# Patient Record
Sex: Female | Born: 1995 | Race: Black or African American | Hispanic: No | Marital: Single | Smoking: Never smoker
Health system: Southern US, Community
[De-identification: ages and names within clinical notes are randomized; demographics above are authoritative.]

## PROBLEM LIST (undated history)

## (undated) ENCOUNTER — Inpatient Hospital Stay (HOSPITAL_COMMUNITY): Payer: Self-pay

## (undated) DIAGNOSIS — D649 Anemia, unspecified: Secondary | ICD-10-CM

## (undated) HISTORY — PX: NO PAST SURGERIES: SHX2092

---
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
## 2018-02-01 LAB — OB RESULTS CONSOLE ANTIBODY SCREEN: ANTIBODY SCREEN: NEGATIVE

## 2018-02-01 LAB — OB RESULTS CONSOLE ABO/RH: ABO/RH(D): O POS

## 2018-04-12 ENCOUNTER — Other Ambulatory Visit: Payer: Self-pay

## 2018-04-12 ENCOUNTER — Encounter (HOSPITAL_COMMUNITY): Payer: Self-pay

## 2018-04-12 ENCOUNTER — Inpatient Hospital Stay (HOSPITAL_COMMUNITY)
Admission: AD | Admit: 2018-04-12 | Discharge: 2018-04-12 | Disposition: A | Discharge status: Home / Self Care | Payer: Medicaid Other | Attending: Obstetrics and Gynecology | Admitting: Obstetrics and Gynecology

## 2018-04-12 DIAGNOSIS — N949 Unspecified condition associated with female genital organs and menstrual cycle: Secondary | ICD-10-CM | POA: Diagnosis not present

## 2018-04-12 DIAGNOSIS — Z880 Allergy status to penicillin: Secondary | ICD-10-CM | POA: Insufficient documentation

## 2018-04-12 DIAGNOSIS — O26892 Other specified pregnancy related conditions, second trimester: Secondary | ICD-10-CM | POA: Diagnosis not present

## 2018-04-12 DIAGNOSIS — Z3A19 19 weeks gestation of pregnancy: Secondary | ICD-10-CM

## 2018-04-12 DIAGNOSIS — R1032 Left lower quadrant pain: Secondary | ICD-10-CM | POA: Insufficient documentation

## 2018-04-12 DIAGNOSIS — R1031 Right lower quadrant pain: Secondary | ICD-10-CM | POA: Diagnosis not present

## 2018-04-12 HISTORY — DX: Anemia, unspecified: D64.9

## 2018-04-12 LAB — URINALYSIS, ROUTINE W REFLEX MICROSCOPIC
Bilirubin Urine: NEGATIVE
Glucose, UA: NEGATIVE mg/dL
Hgb urine dipstick: NEGATIVE
Ketones, ur: NEGATIVE mg/dL
Nitrite: NEGATIVE
PROTEIN: NEGATIVE mg/dL
Specific Gravity, Urine: 1.015 (ref 1.005–1.030)
pH: 7 (ref 5.0–8.0)

## 2018-04-12 MED ORDER — ACETAMINOPHEN 500 MG PO TABS
1000.0000 mg | ORAL_TABLET | Freq: Once | ORAL | Status: AC
  Administered 2018-04-12: 1000 mg via ORAL
  Filled 2018-04-12: qty 2

## 2018-04-12 NOTE — MAU Provider Note (Signed)
History     CSN: [id number]  Arrival date and time: 04/12/18 0005   First Provider Initiated Contact with Patient 04/12/18 0236      Chief Complaint  Patient presents with  . Abdominal Pain   HPI  Ms.  Gail Wong is a 22 y.o. year old G1P0 female at 7260w6d weeks gestation who presents to MAU reporting an "aching pain in LLQ (rated 7-9/10), "sharp" RLQ pain (rated 10/10); both increases with movement. She reports that all the pain started at ~ 2100 on 04/11/18. She denies any N/V/D, VB or LOF. She reports good (+) FM. [facility] [place] [facility]; where she was last seen on 03/24/18. Her next appt is on 04/20/18.  Past Medical History:  Diagnosis Date  . Anemia     Past Surgical History:  Procedure Laterality Date  . NO PAST SURGERIES      Family History  Problem Relation Age of Onset  . Diabetes Maternal Grandmother     Social History   Tobacco Use  . Smoking status: Never Smoker  . Smokeless tobacco: Never Used  Substance Use Topics  . Alcohol use: Never    Frequency: Never  . Drug use: Never    Allergies:  Allergies  Allergen Reactions  . Amoxicillin Rash  . Lavender Oil Rash    Medications Prior to Admission  Medication Sig Dispense Refill Last Dose  . folic acid (FOLVITE) 1 MG tablet Take 1 mg by mouth daily.   04/11/2018 at Unknown time  . Prenatal Vit-Fe Fumarate-FA (PRENATAL MULTIVITAMIN) TABS tablet Take 1 tablet by mouth daily at 12 noon.   04/11/2018 at Unknown time  . Menthol (RICOLA) LOZG Use as directed 1 each in the mouth or throat every 2 (two) hours as needed (sore throat).   03/22/2015 at Unknown time    Review of Systems  Constitutional: Negative.   HENT: Negative.   Eyes: Negative.   Respiratory: Negative.   Cardiovascular: Negative.   Gastrointestinal: Negative.   Endocrine: Negative.   Genitourinary: Positive for pelvic pain (sharp pain in RLQ, aching pain on LLQ; pain on L>R; increases with  movements). Negative for vaginal bleeding, vaginal discharge and vaginal pain.  Musculoskeletal: Negative.   Skin: Negative.   Allergic/Immunologic: Negative.   Neurological: Negative.   Hematological: Negative.   Psychiatric/Behavioral: Negative.    Physical Exam   Blood pressure 110/66, pulse 95, temperature 97.6 F (36.4 C), temperature source Oral, resp. rate 18, height 5' (1.524 m), weight 48.6 kg, SpO2 100 %.  Physical Exam  Nursing note and vitals reviewed. Constitutional: She is oriented to person, place, and time. She appears well-developed and well-nourished.  HENT:  Head: Normocephalic and atraumatic.  Eyes: Pupils are equal, round, and reactive to light. Conjunctivae are normal.  Neck: Normal range of motion.  Cardiovascular: Normal rate, regular rhythm, normal heart sounds and intact distal pulses.  Respiratory: Effort normal and breath sounds normal.  GI: Soft. Bowel sounds are normal. There is abdominal tenderness (in both groin/round ligament area).  Genitourinary:    Genitourinary Comments: Pelvic declined   Musculoskeletal: Normal range of motion.  Neurological: She is alert and oriented to person, place, and time.  Skin: Skin is warm and dry.  Psychiatric: She has a normal mood and affect. Her behavior is normal. Judgment and thought content normal.    MAU Course  Procedures  MDM CCUA Tylenol 1000 mg po -- improved pain FHTs by doppler: 154 bpm  Results for  orders placed or performed during the hospital encounter of 04/12/18 (from the past 24 hour(s))  Urinalysis, Routine w reflex microscopic     Status: Abnormal   Collection Time: 04/12/18  1:21 AM  Result Value Ref Range   Color, Urine YELLOW YELLOW   APPearance CLEAR CLEAR   Specific Gravity, Urine 1.015 1.005 - 1.030   pH 7.0 5.0 - 8.0   Glucose, UA NEGATIVE NEGATIVE mg/dL   Hgb urine dipstick NEGATIVE NEGATIVE   Bilirubin Urine NEGATIVE NEGATIVE   Ketones, ur NEGATIVE NEGATIVE mg/dL    Protein, ur NEGATIVE NEGATIVE mg/dL   Nitrite NEGATIVE NEGATIVE   Leukocytes, UA SMALL (A) NEGATIVE   RBC / HPF 0-5 0 - 5 RBC/hpf   WBC, UA 6-10 0 - 5 WBC/hpf   Bacteria, UA RARE (A) NONE SEEN   Squamous Epithelial / LPF 0-5 0 - 5   Mucus PRESENT     Assessment and Plan  Round ligament pain - Plan: Discharge patient - Demonstrated exercises to stretch round ligaments - Comfort measures given for RLP - Information provided on RLP - Ok to take Tylenol 1000 mg every 6 hrs prn pain - List of [facility] providers given to pt by request - Discharge home - Keep scheduled appt with CNMs on 04/21/18 - Patient verbalized an understanding of the plan of care and agrees.   Raelyn Moraolitta Lauriel Helin, MSN, CNM 04/12/2018, 2:36 AM

## 2018-04-12 NOTE — Discharge Instructions (Signed)
Safe Medications in Pregnancy  ° °Acne: °Benzoyl Peroxide °Salicylic Acid ° °Backache/Headache: °Tylenol: 2 regular strength every 4 hours OR °             2 Extra strength every 6 hours ° °Colds/Coughs/Allergies: °Benadryl (alcohol free) 25 mg every 6 hours as needed °Breath right strips °Claritin °Cepacol throat lozenges °Chloraseptic throat spray °Cold-Eeze- up to three times per day °Cough drops, alcohol free °Flonase (by prescription only) °Guaifenesin °Mucinex °Robitussin DM (plain only, alcohol free) °Saline nasal spray/drops °Sudafed (pseudoephedrine) & Actifed ** use only after [redacted] weeks gestation and if you do not have high blood pressure °Tylenol °Vicks Vaporub °Zinc lozenges °Zyrtec  ° °Constipation: °Colace °Ducolax suppositories °Fleet enema °Glycerin suppositories °Metamucil °Milk of magnesia °Miralax °Senokot °Smooth move tea ° °Diarrhea: °Kaopectate °Imodium A-D ° °*NO pepto Bismol ° °Hemorrhoids: °Anusol °Anusol HC °Preparation H °Tucks ° °Indigestion: °Tums °Maalox °Mylanta °Zantac  °Pepcid ° °Insomnia: °Benadryl (alcohol free) 25mg every 6 hours as needed °Tylenol PM °Unisom, no Gelcaps ° °Leg Cramps: °Tums °MagGel ° °Nausea/Vomiting:  °Bonine °Dramamine °Emetrol °Ginger extract °Sea bands °Meclizine  °Nausea medication to take during pregnancy:  °Unisom (doxylamine succinate 25 mg tablets) Take one tablet daily at bedtime. If symptoms are not adequately controlled, the dose can be increased to a maximum recommended dose of two tablets daily (1/2 tablet in the morning, 1/2 tablet mid-afternoon and one at bedtime). °Vitamin B6 100mg tablets. Take one tablet twice a day (up to 200 mg per day). ° °Skin Rashes: °Aveeno products °Benadryl cream or 25mg every 6 hours as needed °Calamine Lotion °1% cortisone cream ° °Yeast infection: °Gyne-lotrimin 7 °Monistat 7 ° ° °**If taking multiple medications, please check labels to avoid duplicating the same active ingredients °**take medication as directed on  the label °** Do not exceed 4000 mg of tylenol in 24 hours °**Do not take medications that contain aspirin or ibuprofen ° ° ° °[facility] Providers °         °[facility]  ° Phone: [phone] ° °[facility] @ [facility]  ° Phone: [phone] ° °[facility]      ° Phone: [phone]   °         °[facility] @ [facility]    ° Phone: [phone] °         °[facility] @ [place] [facility]  ° Phone: [phone] ° °[facility] ° Phone: [phone] °    °[facility] ° Phone: [phone] ° °

## 2018-04-12 NOTE — MAU Note (Signed)
Pt c/o "aching pain on upper left abdomen, rating pain 7-9, increasing w/movement. Also has sharp pain on upper right abdomen, rating pain 10/10, not currently happening. All started 2100 last night. Denies LOF, bleeding, n/v/d.  +FM

## 2018-05-25 ENCOUNTER — Other Ambulatory Visit: Payer: Self-pay

## 2018-05-25 ENCOUNTER — Inpatient Hospital Stay (HOSPITAL_COMMUNITY)
Admission: AD | Admit: 2018-05-25 | Discharge: 2018-05-26 | DRG: 833 | Disposition: A | Discharge status: Other Acute Inpatient Hospital | Payer: Medicaid Other | Attending: Obstetrics and Gynecology | Admitting: Obstetrics and Gynecology

## 2018-05-25 ENCOUNTER — Inpatient Hospital Stay (HOSPITAL_COMMUNITY): Payer: Medicaid Other

## 2018-05-25 ENCOUNTER — Encounter (HOSPITAL_COMMUNITY): Payer: Self-pay | Admitting: *Deleted

## 2018-05-25 DIAGNOSIS — O4692 Antepartum hemorrhage, unspecified, second trimester: Secondary | ICD-10-CM | POA: Diagnosis not present

## 2018-05-25 DIAGNOSIS — O368320 Maternal care for abnormalities of the fetal heart rate or rhythm, second trimester, not applicable or unspecified: Secondary | ICD-10-CM | POA: Diagnosis present

## 2018-05-25 DIAGNOSIS — O3432 Maternal care for cervical incompetence, second trimester: Secondary | ICD-10-CM | POA: Diagnosis not present

## 2018-05-25 DIAGNOSIS — Z3A26 26 weeks gestation of pregnancy: Secondary | ICD-10-CM | POA: Diagnosis not present

## 2018-05-25 DIAGNOSIS — Z363 Encounter for antenatal screening for malformations: Secondary | ICD-10-CM

## 2018-05-25 LAB — URINALYSIS, ROUTINE W REFLEX MICROSCOPIC
Bilirubin Urine: NEGATIVE
Glucose, UA: NEGATIVE mg/dL
KETONES UR: 40 mg/dL — AB
NITRITE: NEGATIVE
PROTEIN: NEGATIVE mg/dL
Specific Gravity, Urine: 1.02 (ref 1.005–1.030)
pH: 7.5 (ref 5.0–8.0)

## 2018-05-25 LAB — CBC
HCT: 34.1 % — ABNORMAL LOW (ref 36.0–46.0)
Hemoglobin: 11.7 g/dL — ABNORMAL LOW (ref 12.0–15.0)
MCH: 30.2 pg (ref 26.0–34.0)
MCHC: 34.3 g/dL (ref 30.0–36.0)
MCV: 88.1 fL (ref 80.0–100.0)
Platelets: 297 10*3/uL (ref 150–400)
RBC: 3.87 MIL/uL (ref 3.87–5.11)
RDW: 14.1 % (ref 11.5–15.5)
WBC: 10.7 10*3/uL — ABNORMAL HIGH (ref 4.0–10.5)
nRBC: 0 % (ref 0.0–0.2)

## 2018-05-25 LAB — URINALYSIS, MICROSCOPIC (REFLEX)

## 2018-05-25 LAB — WET PREP, GENITAL
Clue Cells Wet Prep HPF POC: NONE SEEN
Sperm: NONE SEEN
Trich, Wet Prep: NONE SEEN
Yeast Wet Prep HPF POC: NONE SEEN

## 2018-05-25 LAB — TYPE AND SCREEN
ABO/RH(D): O POS
Antibody Screen: NEGATIVE

## 2018-05-25 LAB — ABO/RH: ABO/RH(D): O POS

## 2018-05-25 MED ORDER — LACTATED RINGERS IV SOLN: 500.0000 mL | INTRAVENOUS | Status: DC | PRN

## 2018-05-25 MED ORDER — SOD CITRATE-CITRIC ACID 500-334 MG/5ML PO SOLN: 30.0000 mL | ORAL | Status: DC | PRN

## 2018-05-25 MED ORDER — ONDANSETRON HCL 4 MG/2ML IJ SOLN: 4.0000 mg | Freq: Four times a day (QID) | INTRAMUSCULAR | Status: DC | PRN

## 2018-05-25 MED ORDER — CALCIUM CARBONATE ANTACID 500 MG PO CHEW: 2.0000 | CHEWABLE_TABLET | ORAL | Status: DC | PRN

## 2018-05-25 MED ORDER — MAGNESIUM SULFATE 40 G IN LACTATED RINGERS - SIMPLE
3.0000 g/h | INTRAVENOUS | Status: DC
  Administered 2018-05-25: 3 g/h via INTRAVENOUS
  Filled 2018-05-25 (×2): qty 500

## 2018-05-25 MED ORDER — LIDOCAINE HCL (PF) 1 % IJ SOLN
30.0000 mL | INTRAMUSCULAR | Status: DC | PRN
  Filled 2018-05-25: qty 30

## 2018-05-25 MED ORDER — OXYCODONE-ACETAMINOPHEN 5-325 MG PO TABS: 2.0000 | ORAL_TABLET | ORAL | Status: DC | PRN

## 2018-05-25 MED ORDER — OXYTOCIN 40 UNITS IN NORMAL SALINE INFUSION - SIMPLE MED: 2.5000 [IU]/h | INTRAVENOUS | Status: DC

## 2018-05-25 MED ORDER — ACETAMINOPHEN 325 MG PO TABS
650.0000 mg | ORAL_TABLET | ORAL | Status: DC | PRN
  Administered 2018-05-25: 650 mg via ORAL
  Filled 2018-05-25: qty 2

## 2018-05-25 MED ORDER — OXYCODONE-ACETAMINOPHEN 5-325 MG PO TABS: 1.0000 | ORAL_TABLET | ORAL | Status: DC | PRN

## 2018-05-25 MED ORDER — DOCUSATE SODIUM 100 MG PO CAPS
100.0000 mg | ORAL_CAPSULE | Freq: Every day | ORAL | Status: DC
  Filled 2018-05-25: qty 1

## 2018-05-25 MED ORDER — INDOMETHACIN 50 MG PO CAPS
50.0000 mg | ORAL_CAPSULE | Freq: Once | ORAL | Status: AC
  Administered 2018-05-25: 50 mg via ORAL
  Filled 2018-05-25: qty 1

## 2018-05-25 MED ORDER — MAGNESIUM SULFATE BOLUS VIA INFUSION
4.0000 g | Freq: Once | INTRAVENOUS | Status: AC
  Administered 2018-05-25: 4 g via INTRAVENOUS
  Filled 2018-05-25: qty 500

## 2018-05-25 MED ORDER — LACTATED RINGERS IV SOLN
INTRAVENOUS | Status: DC
  Administered 2018-05-25 – 2018-05-26 (×4): via INTRAVENOUS

## 2018-05-25 MED ORDER — INDOMETHACIN 25 MG PO CAPS
25.0000 mg | ORAL_CAPSULE | Freq: Three times a day (TID) | ORAL | Status: DC
  Administered 2018-05-25 – 2018-05-26 (×3): 25 mg via ORAL
  Filled 2018-05-25 (×4): qty 1

## 2018-05-25 MED ORDER — BETAMETHASONE SOD PHOS & ACET 6 (3-3) MG/ML IJ SUSP
12.0000 mg | INTRAMUSCULAR | Status: AC
  Administered 2018-05-25 – 2018-05-26 (×2): 12 mg via INTRAMUSCULAR
  Filled 2018-05-25 (×2): qty 2

## 2018-05-25 MED ORDER — PRENATAL MULTIVITAMIN CH
1.0000 | ORAL_TABLET | Freq: Every day | ORAL | Status: DC
  Filled 2018-05-25: qty 1

## 2018-05-25 MED ORDER — ZOLPIDEM TARTRATE 5 MG PO TABS: 5.0000 mg | ORAL_TABLET | Freq: Every evening | ORAL | Status: DC | PRN

## 2018-05-25 MED ORDER — OXYTOCIN BOLUS FROM INFUSION: 500.0000 mL | Freq: Once | INTRAVENOUS | Status: DC

## 2018-05-25 NOTE — Progress Notes (Signed)
Gail Wong is a 23 y.o. G1P0 at [redacted]w[redacted]d by LMP admitted for Preterm labor, bulging membranes and advanced dilatation.   Subjective: Patient is now comfortable in trendelenberg position, contractions are rare, cat I strip for 26 weeker. No LOF. Minimal VB. Not ambulating, SCDs in place. Denies abdominal pain.   Objective: BP 101/68   Pulse 99   Temp 98 F (36.7 C) (Oral)   Resp 18   Wt 49 kg   LMP  (LMP Unknown)   SpO2 100%   BMI 21.12 kg/m  No intake/output data recorded. No intake/output data recorded.  FHT:  FHR: 145 bpm, variability: moderate,  accelerations:  Present,  decelerations:  Absent UC:   Rare now SVE:   Dilation: 4 Effacement (%): 70 Station: Ballotable Exam by:: Wynelle Bourgeois CNM    PELVIC EXAM:  Dark red blood in posterior vault, visually bulging membrane with what appears is head through cervical os, appears 5cm dilated and about 90 percent effaced.   Labs: Lab Results  Component Value Date   WBC 10.7 (H) 05/25/2018   HGB 11.7 (L) 05/25/2018   HCT 34.1 (L) 05/25/2018   MCV 88.1 05/25/2018   PLT 297 05/25/2018    Assessment / Plan: 23 y/o G1P0 at [redacted]w[redacted]d by LMP here with PTL with bulging membranes, and advanced dilatation  Labor: Quiet, but advanced dilatation and change in dilation/effacement since arrival this AM. Preeclampsia:  NEG Fetal Wellbeing:  Category I Pain Control:  Indomethacin protocol I/D:  Unknown, awaiting culture results Anticipated MOD:  Delayed labor, cephalic presentation  PTL: Mag gtt @ 3g/hr, Indomethacin 25mg  q8h PO, BMZ given at 06:45, to be repeated tomorrow AM at 06:45. Continue monitoring on L&D until stable without change.  Spoke with Dr. Vergie Living who agrees with plan.   Jen Mow, DO 05/25/2018, 2:54 PM

## 2018-05-25 NOTE — Progress Notes (Signed)
Gail Wong is a 23 y.o. G1P0 at [redacted]w[redacted]d by LMP admitted for Preterm labor, bulging membranes  Subjective: Patient comfortable in room in trendelenberg. She no longer feels any contractions (for the last 4+ hours). Denies LOF, VB.   Objective: BP 109/74   Pulse 100   Temp 98 F (36.7 C) (Oral)   Resp 18   Wt 49 kg   LMP  (LMP Unknown)   SpO2 93%   BMI 21.12 kg/m  I/O last 3 completed shifts: In: -  Out: 350 [Urine:350] No intake/output data recorded.  FHT:  FHR: 145 bpm, variability: moderate,  accelerations:  Present,  decelerations:  Absent UC:   none SVE:   Dilation: 5 Effacement (%): 90 Station: Ballotable Exam by:: Dr Omer Jack  Bulging membranes  Labs: Lab Results  Component Value Date   WBC 10.7 (H) 05/25/2018   HGB 11.7 (L) 05/25/2018   HCT 34.1 (L) 05/25/2018   MCV 88.1 05/25/2018   PLT 297 05/25/2018    Assessment / Plan: 23 y/o G1P0 at [redacted]w[redacted]d here for preterm labor with bulging membranes and advanced dilatation  Labor: Medically stopped labor progression, advanced dilatation with bulging membranes Preeclampsia:  None Fetal Wellbeing:  Category I Pain Control:  Indomethacin for contractions I/D:  GBS culture pending, preterm unknown currently, intact Anticipated MOD:  NSVD if necessary, cephalic presentation Regular diet  Spoke with Dr. Shawnie Pons who agrees with transfer and care. Patient made aware to inform staff IMMEDIATELY if contractions return.  Jen Mow, DO 05/25/2018, 7:37 PM

## 2018-05-25 NOTE — MAU Note (Signed)
Pt presents to MAU c/o vaginal bleeding and abdominal cramping. Pt states she was cramping all night and when she woke up to use the bathroom she noticed some bleeding when she wiped and then saw a clot. Pt reports +FM. Pt denies LOF. Pain is 8/10.

## 2018-05-25 NOTE — H&P (Signed)
LABOR AND DELIVERY ADMISSION HISTORY AND PHYSICAL NOTE  Gail Wong is a 23 y.o. female G1P0 with IUP at [redacted]w[redacted]d by LMP=7w [place] presenting for preterm labor.  Had intercourse yesterday evening. After that she started to have contractions, which have become more frequent and painful. In MAU, was checked and was 4cm dilated. Has been getting prenatal care at [facility]  She reports positive fetal movement. She denies leakage of fluid or vaginal bleeding.  Prenatal History/Complications: PNC at [facility] Pregnancy complications:  - Varicella and rubella nonimmune  Past Medical History: Past Medical History:  Diagnosis Date  . Anemia    last Hgb 12.0 from 01/31/18 lab result under care everywhere ([facility])    Past Surgical History: Past Surgical History:  Procedure Laterality Date  . NO PAST SURGERIES      Obstetrical History: OB History    Gravida  1   Para      Term      Preterm      AB      Living        SAB      TAB      Ectopic      Multiple      Live Births              Social History: Social History   Socioeconomic History  . Marital status: Single    Spouse name: Not on file  . Number of children: Not on file  . Years of education: Not on file  . Highest education level: Not on file  Occupational History  . Not on file  Social Needs  . Financial resource strain: Not on file  . Food insecurity:    Worry: Not on file    Inability: Not on file  . Transportation needs:    Medical: Not on file    Non-medical: Not on file  Tobacco Use  . Smoking status: Never Smoker  . Smokeless tobacco: Never Used  Substance and Sexual Activity  . Alcohol use: Never    Frequency: Never  . Drug use: Never  . Sexual activity: Yes    Comment: last intercourse 05/24/2018  Lifestyle  . Physical activity:    Days per week: Not on file    Minutes per session: Not on file  . Stress: Not on file  Relationships  . Social connections:    Talks on phone: Not on  file    Gets together: Not on file    Attends religious service: Not on file    Active member of club or organization: Not on file    Attends meetings of clubs or organizations: Not on file    Relationship status: Not on file  Other Topics Concern  . Not on file  Social History Narrative  . Not on file    Family History: Family History  Problem Relation Age of Onset  . Diabetes Maternal Grandmother     Allergies: Allergies  Allergen Reactions  . Amoxicillin Rash  . Lavender Oil Rash  . Pork-Derived Products Rash    HA and vomiting    Medications Prior to Admission  Medication Sig Dispense Refill Last Dose  . folic acid (FOLVITE) 1 MG tablet Take 1 mg by mouth daily.   05/24/2018 at Unknown time  . Prenatal Vit-Fe Fumarate-FA (PRENATAL MULTIVITAMIN) TABS tablet Take 1 tablet by mouth daily at 12 noon.   05/24/2018 at Unknown time  . Menthol (RICOLA) LOZG Use as directed 1 each in  the mouth or throat every 2 (two) hours as needed (sore throat).   03/22/2015 at Unknown time     Review of Systems  All systems reviewed and negative except as stated in HPI  Physical Exam Blood pressure 112/70, pulse 96, temperature 97.7 F (36.5 C), weight 49 kg. General appearance: alert, oriented, tearful Lungs: normal respiratory effort Heart: regular rate Abdomen: soft, non-tender; gravid, FH appropriate for GA Extremities: No calf swelling or tenderness Presentation: cephalic Fetal monitoring: 150/mod variability/+ acels/?variable decels  Uterine activity: regular painful contractions Dilation: 4 Effacement (%): 70 Station: Ballotable Exam by:: Wynelle Bourgeois CNM   Prenatal labs: ABO, Rh: --/--/O positive (10/16 0000) Antibody: Negative (10/16 0000) Rubella:  nonimmune RPR:   nonreactive HBsAg:   negative HIV:   negative GC/Chlamydia: negative GBS:   pending (collected on admission) 2-hr GTT: none Genetic screening:  declined Anatomy US: normal  Prenatal Transfer Tool   Maternal Diabetes: No Genetic Screening: Declined Maternal Ultrasounds/Referrals: Normal Fetal Ultrasounds or other Referrals:  None Maternal Substance Abuse:  No Significant Maternal Medications:  None Significant Maternal Lab Results: None  Results for orders placed or performed during the hospital encounter of 05/25/18 (from the past 24 hour(s))  Urinalysis, Routine w reflex microscopic   Collection Time: 05/25/18  5:50 AM  Result Value Ref Range   Color, Urine YELLOW YELLOW   APPearance CLEAR CLEAR   Specific Gravity, Urine 1.020 1.005 - 1.030   pH 7.5 5.0 - 8.0   Glucose, UA NEGATIVE NEGATIVE mg/dL   Hgb urine dipstick LARGE (A) NEGATIVE   Bilirubin Urine NEGATIVE NEGATIVE   Ketones, ur 40 (A) NEGATIVE mg/dL   Protein, ur NEGATIVE NEGATIVE mg/dL   Nitrite NEGATIVE NEGATIVE   Leukocytes, UA LARGE (A) NEGATIVE  Urinalysis, Microscopic (reflex)   Collection Time: 05/25/18  5:50 AM  Result Value Ref Range   RBC / HPF 6-10 0 - 5 RBC/hpf   WBC, UA 6-10 0 - 5 WBC/hpf   Bacteria, UA FEW (A) NONE SEEN   Squamous Epithelial / LPF 0-5 0 - 5    Patient Active Problem List   Diagnosis Date Noted  . Preterm labor 05/25/2018  . Round ligament pain 04/12/2018    Assessment: Gail Wong is a 23 y.o. G1P0 at [redacted]w[redacted]d here for preterm labor  #Labor: will give fluids, magnesium, betamethasone, indomethacin protocol. NICU notified.  #Pain: Desires unmedicated delivery #FWB:  cat 1 #ID:  GBS pending #MOF: breast #MOC: discuss at follow up  #Circ:  ?  Gwenevere Abbot, MD  05/25/2018, 6:45 AM

## 2018-05-25 NOTE — Consult Note (Signed)
Asked by Dr.Arnold to provide prenatal consultation for patient at risk for preterm delivery due to preterm labor at 26.0 wks.  Mother is 23 y.o. G1 P0 who had onset UCs last night and found to be 4 cm dilated with bulging membranes.  Pregnancy previously uncomplicated. She is being treated with betamethasone (first dose about 0645 today), MgSO4, and indomethacin; and she is in [place] position.  FHR shows good variability.  Patient was somnolent and only intermittently responsive during my discussion, but FOB and both MGM and PGM were present and interactive.  I discussed usual expectations for preterm infant at 57 - [redacted] weeks gestation, including possible needs for DR resuscitation, respiratory support, IV access, and blood products.  Also presented risks of death or serious morbidity, and projected possible length of stay in NICU until EDC, i.e [redacted] wks EGA.  Discussed advantages of feeding with mother's milk and possible use of donor milk.  MGM stated she breast fed all of her children and would be supportive.  Thank you for consulting Neonatology. Total time 30 minutes, 20 minutes face-to-face  JWimmer, MD

## 2018-05-25 NOTE — MAU Provider Note (Signed)
Chief Complaint:  Vaginal Bleeding   First Provider Initiated Contact with Patient 05/25/18 [phone]     HPI: Gail Wong is a 23 y.o. G1P0 at 44w0dwho presents to maternity admissions reporting bleeding when she went to bathroom at 4am. Has had cramping much of the day.  Did have IC at 6pm. She reports good fetal movement, denies LOF, vaginal itching/burning, urinary symptoms, h/a, dizziness, n/v, diarrhea, constipation or fever/chills.    Pregnancy has been followed at [facility] and has been uncomplicated.  Went there despite living in [place] because she wanted midwifery care and when she called around she was told there are no midwives in [place]. States had some cramping a few weeks ago and was told it was round ligament pain.  Vaginal Bleeding  The patient's primary symptoms include pelvic pain and vaginal bleeding. The patient's pertinent negatives include no genital itching, genital lesions or genital odor. This is a new problem. The current episode started today. The problem occurs constantly. The problem has been unchanged. The pain is moderate. The problem affects both sides. She is pregnant. Associated symptoms include abdominal pain. Pertinent negatives include no chills, constipation, diarrhea, fever, nausea or vomiting. The vaginal discharge was bloody. The vaginal bleeding is lighter than menses. She has been passing clots. She has not been passing tissue. Nothing aggravates the symptoms. She has tried nothing for the symptoms. She is sexually active.    RN note: Pt presents to MAU c/o vaginal bleeding and abdominal cramping. Pt states she was cramping all night and when she woke up to use the bathroom she noticed some bleeding when she wiped and then saw a clot. Pt reports +FM. Pt denies LOF. Pain is 8/10.  Past Medical History: Past Medical History:  Diagnosis Date  . Anemia    last Hgb 12.0 from 01/31/18 lab result under care everywhere ([facility])    Past obstetric history: OB  History  Gravida Para Term Preterm AB Living  1            SAB TAB Ectopic Multiple Live Births               # Outcome Date GA Lbr Len/2nd Weight Sex Delivery Anes PTL Lv  1 Current             Past Surgical History: Past Surgical History:  Procedure Laterality Date  . NO PAST SURGERIES      Family History: Family History  Problem Relation Age of Onset  . Diabetes Maternal Grandmother     Social History: Social History   Tobacco Use  . Smoking status: Never Smoker  . Smokeless tobacco: Never Used  Substance Use Topics  . Alcohol use: Never    Frequency: Never  . Drug use: Never    Allergies:  Allergies  Allergen Reactions  . Amoxicillin Rash  . Lavender Oil Rash    Meds:  Medications Prior to Admission  Medication Sig Dispense Refill Last Dose  . folic acid (FOLVITE) 1 MG tablet Take 1 mg by mouth daily.   05/24/2018 at Unknown time  . Prenatal Vit-Fe Fumarate-FA (PRENATAL MULTIVITAMIN) TABS tablet Take 1 tablet by mouth daily at 12 noon.   05/24/2018 at Unknown time  . Menthol (RICOLA) LOZG Use as directed 1 each in the mouth or throat every 2 (two) hours as needed (sore throat).   03/22/2015 at Unknown time    I have reviewed patient's Past Medical Hx, Surgical Hx, Family Hx, Social Hx, medications and allergies.  ROS:  Review of Systems  Constitutional: Negative for chills and fever.  Gastrointestinal: Positive for abdominal pain. Negative for constipation, diarrhea, nausea and vomiting.  Genitourinary: Positive for pelvic pain and vaginal bleeding.   Other systems negative  Physical Exam   Patient Vitals for the past 24 hrs:  BP Temp Pulse Weight  05/25/18 0554 112/70 97.7 F (36.5 C) 96 -  05/25/18 0549 - - - 49 kg   Constitutional: Well-developed, well-nourished female in no acute distress.  Cardiovascular: normal rate and rhythm Respiratory: normal effort, clear to auscultation bilaterally GI: Abd soft, non-tender, gravid appropriate for  gestational age.   No rebound or guarding. MS: Extremities nontender, no edema, normal ROM Neurologic: Alert and oriented x 4.  GU: Neg CVAT.  PELVIC EXAM: Cervix open 4cm, bulging membranes.  Light red mucous in vault, no ferning    FHT:  Baseline 140 , moderate variability, accelerations present, no decelerations Contractions: q 2-3 mins    Labs: Results for orders placed or performed during the hospital encounter of 05/25/18 (from the past 24 hour(s))  Urinalysis, Routine w reflex microscopic     Status: Abnormal   Collection Time: 05/25/18  5:50 AM  Result Value Ref Range   Color, Urine YELLOW YELLOW   APPearance CLEAR CLEAR   Specific Gravity, Urine 1.020 1.005 - 1.030   pH 7.5 5.0 - 8.0   Glucose, UA NEGATIVE NEGATIVE mg/dL   Hgb urine dipstick LARGE (A) NEGATIVE   Bilirubin Urine NEGATIVE NEGATIVE   Ketones, ur 40 (A) NEGATIVE mg/dL   Protein, ur NEGATIVE NEGATIVE mg/dL   Nitrite NEGATIVE NEGATIVE   Leukocytes, UA LARGE (A) NEGATIVE  Urinalysis, Microscopic (reflex)     Status: Abnormal   Collection Time: 05/25/18  5:50 AM  Result Value Ref Range   RBC / HPF 6-10 0 - 5 RBC/hpf   WBC, UA 6-10 0 - 5 WBC/hpf   Bacteria, UA FEW (A) NONE SEEN   Squamous Epithelial / LPF 0-5 0 - 5    --/--/O positive (10/16 0000)  Imaging:  No results found.  MAU Course/MDM: I have ordered labs and placed admit orders NST reviewed, reassuring Consult Dr Despina HiddenEure with presentation, exam findings and test results.   Placed in Trendelenberg position to help membranes recede. IV started and Magnesium Sulfate infusion ordered (4g/2g/hr) Cultures obtained, GC, Chlam, GBS, Wet prep) Will insert foley into bladder for 24 hrs.   US ordered for evaluation and measurements NICU consult ordered  Assessment: Single intrauterine pregnancy at 4739w0d Preterm labor with bulging membranes No evidence of PPROM Bleeding is likely due to cervical changes  [facility]  If  stabilizes will move to Antenatal See above for plan Dr Aneta MinsPhillip notified  Wynelle BourgeoisMarie Sohaib Vereen CNM, MSN Certified Nurse-Midwife 05/25/2018 6:09 AM

## 2018-05-26 DIAGNOSIS — Z3A26 26 weeks gestation of pregnancy: Secondary | ICD-10-CM

## 2018-05-26 LAB — HIV ANTIBODY (ROUTINE TESTING W REFLEX): HIV Screen 4th Generation wRfx: NONREACTIVE

## 2018-05-26 NOTE — Progress Notes (Signed)
Accepting provider Dr Mcneil Sober

## 2018-05-26 NOTE — Progress Notes (Signed)
Report called to L. Manson Passey Charity fundraiser at [facility].

## 2018-05-26 NOTE — Discharge Summary (Addendum)
Discharge Summary   Admit Date: 05/25/2018 Discharge Date: 05/26/2018 Discharging Service: Antepartum  Primary OBGYN: [facility] [place], [place]) Admitting Physician: Lazaro Arms, MD  Discharge Physician: Vergie Living  Referring Provider: [facility] Triage  Primary Care Provider: Patient, No Pcp Per  Admission Diagnoses: *Pregnancy at 26/0 *VB and abdominal pain *Preterm labor  Discharge Diagnoses: *Pregnancy at 26/1 *Resolved preterm labor  Consult Orders: IP CONSULT TO NEONATOLOGY   Surgeries/Procedures Performed: Anatomy u/s  History and Physical: Attestation signed by Lazaro Arms, MD at 05/25/2018 1:23 PM  Attestation of Attending Supervision of Advanced Practitioner (CNM/NP/PA): Evaluation and management procedures were performed by the Advanced Practitioner under my supervision and collaboration. I have reviewed the Advanced Practitioner's note and chart, and I agree with the management and plan.  Rockne Coons MD Attending Physician for the Center for [facility] 05/25/2018 1:23 PM       LABOR AND DELIVERY ADMISSION HISTORY AND PHYSICAL NOTE  Gail Wong is a 23 y.o. female G1P0 with IUP at [redacted]w[redacted]d by LMP=7w [place] presenting for preterm labor.  Had intercourse yesterday evening. After that she started to have contractions, which have become more frequent and painful. In MAU, was checked and was 4cm dilated. Has been getting prenatal care at [facility].  She reports positive fetal movement. She denies leakage of fluid or vaginal bleeding.  Prenatal History/Complications: PNC at [facility] Pregnancy complications:  - Varicella and rubella nonimmune  Past Medical History:     Past Medical History:  Diagnosis Date  . Anemia    last Hgb 12.0 from 01/31/18 lab result under care everywhere ([facility])    Past Surgical History:      Past Surgical History:  Procedure Laterality Date  . NO PAST SURGERIES      Obstetrical History:        OB History    Gravida  1   Para      Term      Preterm      AB      Living        SAB      TAB      Ectopic      Multiple      Live Births              Social History: Social History        Socioeconomic History  . Marital status: Single    Spouse name: Not on file  . Number of children: Not on file  . Years of education: Not on file  . Highest education level: Not on file  Occupational History  . Not on file  Social Needs  . Financial resource strain: Not on file  . Food insecurity:    Worry: Not on file    Inability: Not on file  . Transportation needs:    Medical: Not on file    Non-medical: Not on file  Tobacco Use  . Smoking status: Never Smoker  . Smokeless tobacco: Never Used  Substance and Sexual Activity  . Alcohol use: Never    Frequency: Never  . Drug use: Never  . Sexual activity: Yes    Comment: last intercourse 05/24/2018  Lifestyle  . Physical activity:    Days per week: Not on file    Minutes per session: Not on file  . Stress: Not on file  Relationships  . Social connections:    Talks on phone: Not on file    Gets together: Not  on file    Attends religious service: Not on file    Active member of club or organization: Not on file    Attends meetings of clubs or organizations: Not on file    Relationship status: Not on file  Other Topics Concern  . Not on file  Social History Narrative  . Not on file    Family History:      Family History  Problem Relation Age of Onset  . Diabetes Maternal Grandmother     Allergies:      Allergies  Allergen Reactions  . Amoxicillin Rash  . Lavender Oil Rash  . Pork-Derived Products Rash    HA and vomiting           Medications Prior to Admission  Medication Sig Dispense Refill Last Dose  . folic acid (FOLVITE) 1 MG tablet Take 1 mg by mouth daily.   05/24/2018 at Unknown time  . Prenatal Vit-Fe Fumarate-FA (PRENATAL  MULTIVITAMIN) TABS tablet Take 1 tablet by mouth daily at 12 noon.   05/24/2018 at Unknown time  . Menthol (RICOLA) LOZG Use as directed 1 each in the mouth or throat every 2 (two) hours as needed (sore throat).   03/22/2015 at Unknown time     Review of Systems  All systems reviewed and negative except as stated in HPI  Physical Exam Blood pressure 112/70, pulse 96, temperature 97.7 F (36.5 C), weight 49 kg. General appearance: alert, oriented, tearful Lungs: normal respiratory effort Heart: regular rate Abdomen: soft, non-tender; gravid, FH appropriate for GA Extremities: No calf swelling or tenderness Presentation: cephalic Fetal monitoring: 150/mod variability/+ acels/?variable decels  Uterine activity: regular painful contractions Dilation: 4 Effacement (%): 70 Station: Ballotable Exam by:: Wynelle BourgeoisMarie Williams CNM   Prenatal labs: ABO, Rh: --/--/O positive (10/16 0000) Antibody: Negative (10/16 0000) Rubella:  nonimmune RPR:   nonreactive HBsAg:   negative HIV:   negative GC/Chlamydia: negative GBS:   pending (collected on admission) 2-hr GTT: none Genetic screening:  declined Anatomy US: normal  Prenatal Transfer Tool  Maternal Diabetes: No Genetic Screening: Declined Maternal Ultrasounds/Referrals: Normal Fetal Ultrasounds or other Referrals:  None Maternal Substance Abuse:  No Significant Maternal Medications:  None Significant Maternal Lab Results: None       Results for orders placed or performed during the hospital encounter of 05/25/18 (from the past 24 hour(s))  Urinalysis, Routine w reflex microscopic   Collection Time: 05/25/18  5:50 AM  Result Value Ref Range   Color, Urine YELLOW YELLOW   APPearance CLEAR CLEAR   Specific Gravity, Urine 1.020 1.005 - 1.030   pH 7.5 5.0 - 8.0   Glucose, UA NEGATIVE NEGATIVE mg/dL   Hgb urine dipstick LARGE (A) NEGATIVE   Bilirubin Urine NEGATIVE NEGATIVE   Ketones, ur 40 (A) NEGATIVE mg/dL    Protein, ur NEGATIVE NEGATIVE mg/dL   Nitrite NEGATIVE NEGATIVE   Leukocytes, UA LARGE (A) NEGATIVE  Urinalysis, Microscopic (reflex)   Collection Time: 05/25/18  5:50 AM  Result Value Ref Range   RBC / HPF 6-10 0 - 5 RBC/hpf   WBC, UA 6-10 0 - 5 WBC/hpf   Bacteria, UA FEW (A) NONE SEEN   Squamous Epithelial / LPF 0-5 0 - 5        Patient Active Problem List   Diagnosis Date Noted  . Preterm labor 05/25/2018  . Round ligament pain 04/12/2018    Assessment: Jerrell MylarMayollie Leavitt is a 23 y.o. G1P0 at 8317w0d here for preterm labor  #  Labor: will give fluids, magnesium, betamethasone, indomethacin protocol. NICU notified.  #Pain:  Desires unmedicated delivery #FWB:  cat 1 #ID:      GBS pending #MOF: breast #MOC: discuss at follow up  #Circ:   ?  Gwenevere Abbot, MD  05/25/2018, 6:45 AM         Cosigned by: Lazaro Arms, MD at 05/25/2018 1:23 PM  Electronically signed by Gwenevere Abbot, MD at 05/25/2018 8:38 AM Electronically signed by Lazaro Arms, MD at 05/25/2018 1:23 PM    Hospital Course: *Pregnancy: fetal status reassuring during hospitalization with category I tracing *Preterm labor: Patient started on Mg and Indocin, with Mg increased to 3gm per hour. Patient examined twice via SVE and spec exam and was unchanged at 4-5cm/70% with fetal head noted in the BOW. Patient transferred to AP service. NICU full and d/w pt re: transfer to [facility] and she was amenable to this. Pt never started on abx. Will start ancef for PTL and GBS status pending. BMZ #2 given at 0600 on 2/7. Anatomy u/s normal with fetus cephalic, normal afi, efw 52%, 864gm, ac 46%.  Dr.Valaoras accepted the transfer. Will continue Mg, Indocin and Ancef *PPx: SCDs *FEN/GI: clears, MIVF *Dispo: see above.   Discharge Exam:   Current Vital Signs 24h Vital Sign Ranges  T 98.2 F (36.8 C) Temp  Avg: 98 F (36.7 C)  Min: 97.7 F (36.5 C)  Max: 98.4 F (36.9 C)  BP 106/63 BP  Min: 93/55  Max:  114/73  HR 92 Pulse  Avg: 96.9  Min: 80  Max: 110  RR 16 Resp  Avg: 17.2  Min: 16  Max: 18  SaO2 100 % Room Air SpO2  Avg: 98.4 %  Min: 93 %  Max: 100 %       24 Hour I/O Current Shift I/O  Time Ins Outs 02/06 0701 - 02/07 0700 In: 4658.9 [P.O.:860; I.V.:3798.9] Out: 2150 [Urine:2150] 02/07 0701 - 02/07 1900 In: 240 [P.O.:240] Out: 550 [Urine:550]   UOP: >116mL/hr  General appearance: Well nourished, well developed female in no acute distress.  Cardiovascular: S1, S2 normal, no murmur, rub or gallop, regular rate and rhythm Respiratory:  Clear to auscultation bilateral. Normal respiratory effort Abdomen: gravid, nttp Neuro/Psych:  Normal mood and affect. 1+brachial b/l Skin:  Warm and dry.   Discharge Disposition:  [facility]  Patient Instructions:  Standard   Results Pending at Discharge:  GBS GC/CT swab  45 minutes spent on discharge and planning for patient.   Cornelia Copa MD Attending Center for [facility])

## 2018-05-26 NOTE — Progress Notes (Addendum)
Daily Antepartum Note  Admission Date: 05/25/2018 Current Date: 05/26/2018 10:03 AM  Gail Wong is a 23 y.o. G1P0 @ [redacted]w[redacted]d, HD#2, admitted for PTL.  Pregnancy complicated by: Patient Active Problem List   Diagnosis Date Noted  . Preterm labor 05/25/2018  . Round ligament pain 04/12/2018    Overnight/24hr events:  none  Subjective:  Patient tired but overall feels well. No s/s of  Mg toxicity or labor. Just some pink spotting when she goes to the bathroom.   Objective:    Current Vital Signs 24h Vital Sign Ranges  T 98.2 F (36.8 C) Temp  Avg: 98 F (36.7 C)  Min: 97.7 F (36.5 C)  Max: 98.4 F (36.9 C)  BP 106/63 BP  Min: 93/55  Max: 114/73  HR 92 Pulse  Avg: 97.3  Min: 80  Max: 110  RR 16 Resp  Avg: 17.1  Min: 16  Max: 18  SaO2 100 % Room Air SpO2  Avg: 98.4 %  Min: 93 %  Max: 100 %       24 Hour I/O Current Shift I/O  Time Ins Outs 02/06 0701 - 02/07 0700 In: 4658.9 [P.O.:860; I.V.:3798.9] Out: 2150 [Urine:2150] 02/07 0701 - 02/07 1900 In: 0  Out: 550 [Urine:550]   Patient Vitals for the past 24 hrs:  BP Temp Temp src Pulse Resp SpO2  05/26/18 0856 106/63 98.2 F (36.8 C) Oral 92 16 100 %  05/26/18 0400 - - - - 18 -  05/26/18 0319 99/70 97.8 F (36.6 C) Oral 87 18 100 %  05/26/18 0300 - - - - 16 -  05/26/18 0200 - - - - 16 -  05/26/18 0100 - - - - 16 -  05/25/18 2302 102/64 97.7 F (36.5 C) Oral 95 16 99 %  05/25/18 2201 97/65 98 F (36.7 C) Oral 95 16 100 %  05/25/18 2128 95/63 97.9 F (36.6 C) Oral 95 18 -  05/25/18 1944 - - - - 18 -  05/25/18 1847 98/77 - - 98 18 -  05/25/18 1800 100/68 - - 80 18 -  05/25/18 1713 114/73 98.4 F (36.9 C) Oral 98 16 -  05/25/18 1615 109/74 - - 100 18 -  05/25/18 1515 113/66 - - 98 16 93 %  05/25/18 1423 106/70 - - (!) 102 18 -  05/25/18 1305 101/68 - - 99 18 -  05/25/18 1224 (!) 93/55 98 F (36.7 C) Oral (!) 107 18 -  05/25/18 1102 (!) 97/59 - - (!) 110 18 -  05/25/18 1017 103/64 - - (!) 103 16 -   FHT: 130  baseline, ?accel, no decel, mod variability Toco: quiet  Physical exam: General: Well nourished, well developed female in no acute distress. Abdomen: gravid nttp Neuro: 1+ brachial b/l Cardiovascular: S1, S2 normal, no murmur, rub or gallop, regular rate and rhythm Respiratory: CTAB Extremities: no clubbing, cyanosis or edema Skin: Warm and dry.   Medications: Current Facility-Administered Medications  Medication Dose Route Frequency Provider Last Rate Last Dose  . acetaminophen (TYLENOL) tablet 650 mg  650 mg Oral Q4H PRN Michaele Offer, DO   650 mg at 05/25/18 1846  . calcium carbonate (TUMS - dosed in mg elemental calcium) chewable tablet 400 mg of elemental calcium  2 tablet Oral Q4H PRN Mumaw, Hiram Comber, DO      . indomethacin (INDOCIN) capsule 25 mg  25 mg Oral Q8H Mumaw, [place], DO   25 mg at 05/26/18 0530  .  lactated ringers infusion 500-1,000 mL  500-1,000 mL Intravenous PRN Mumaw, Hiram Comber, DO      . lactated ringers infusion   Intravenous Continuous Michaele Offer, DO 125 mL/hr at 05/26/18 2956    . lidocaine (PF) (XYLOCAINE) 1 % injection 30 mL  30 mL Subcutaneous PRN Mumaw, Hiram Comber, DO      . magnesium sulfate 40 grams in LR 500 mL OB infusion  3 g/hr Intravenous Titrated Mumaw, Hiram Comber, DO 37.5 mL/hr at 05/26/18 0700 3 g/hr at 05/26/18 0700  . ondansetron (ZOFRAN) injection 4 mg  4 mg Intravenous Q6H PRN Mumaw, Hiram Comber, DO      . oxyCODONE-acetaminophen (PERCOCET/ROXICET) 5-325 MG per tablet 1 tablet  1 tablet Oral Q4H PRN Mumaw, Hiram Comber, DO      . oxyCODONE-acetaminophen (PERCOCET/ROXICET) 5-325 MG per tablet 2 tablet  2 tablet Oral Q4H PRN Mumaw, Hiram Comber, DO      . oxytocin (PITOCIN) IV BOLUS FROM BAG  500 mL Intravenous Once Mumaw, Hiram Comber, DO      . oxytocin (PITOCIN) IV infusion 40 units in NS 1000 mL - Premix  2.5 Units/hr Intravenous Continuous Mumaw,  Hiram Comber, DO      . prenatal multivitamin tablet 1 tablet  1 tablet Oral Q1200 Mumaw, Hiram Comber, DO      . sodium citrate-citric acid (ORACIT) solution 30 mL  30 mL Oral Q2H PRN Mumaw, Hiram Comber, DO        Labs: no new labs  Radiology: no new imaging  Assessment & Plan:  Pt doing well *Pregnancy: fetal status reassuring, category I tracing *Preterm labor: continue Mg and indocin. Pt stable. Hasn't been on abx. Will write for ancef.  *Preterm: BMZ #2 given this morning at 0545. D/w pt re: transfer to primary OB at [facility] given that NICU is full here and pt is amenable to this. Will call them re: possible transfer.  2/6: cephalic, normal AFI, 864gm, 52%, ac 34%, normal anatomy. Marland Kitchen  *PPx: SCDs *FEN/GI: clears, MIVF *Dispo: see above.   Cornelia Copa MD Attending Center for [facility])

## 2018-05-27 LAB — GC/CHLAMYDIA PROBE AMP (~~LOC~~) NOT AT ARMC
Chlamydia: NEGATIVE
Neisseria Gonorrhea: NEGATIVE

## 2018-05-27 LAB — CULTURE, BETA STREP (GROUP B ONLY)

## 2018-05-27 MED ORDER — GENERIC EXTERNAL MEDICATION
1.00 | Status: DC
Start: 2018-05-28 — End: 2018-05-27

## 2018-05-27 MED ORDER — LACTATED RINGERS IV SOLN
50.00 | INTRAVENOUS | Status: DC
Start: ? — End: 2018-05-27

## 2018-05-27 MED ORDER — FENTANYL-BUPIVACAINE-NACL 0.5-0.1-0.9 MG/250ML-% EP SOLN
10.00 | EPIDURAL | Status: DC
Start: ? — End: 2018-05-27

## 2018-05-27 MED ORDER — GENERIC EXTERNAL MEDICATION
.08 | Status: DC
Start: ? — End: 2018-05-27

## 2018-05-27 MED ORDER — DIPHENHYDRAMINE HCL 50 MG/ML IJ SOLN
12.50 | INTRAMUSCULAR | Status: DC
Start: ? — End: 2018-05-27

## 2018-05-29 MED ORDER — OXYCODONE HCL 10 MG PO TABS
10.00 | ORAL_TABLET | ORAL | Status: DC
Start: ? — End: 2018-05-29

## 2018-05-29 MED ORDER — ZOLPIDEM TARTRATE 5 MG PO TABS
5.00 | ORAL_TABLET | ORAL | Status: DC
Start: ? — End: 2018-05-29

## 2018-05-29 MED ORDER — BENZOCAINE-MENTHOL 15-3.6 MG MT LOZG
1.00 | LOZENGE | OROMUCOSAL | Status: DC
Start: ? — End: 2018-05-29

## 2018-05-29 MED ORDER — HYDROMORPHONE HCL 1 MG/ML IJ SOLN
1.00 | INTRAMUSCULAR | Status: DC
Start: ? — End: 2018-05-29

## 2018-05-29 MED ORDER — BISACODYL 10 MG RE SUPP
10.00 | RECTAL | Status: DC
Start: ? — End: 2018-05-29

## 2018-05-29 MED ORDER — SALINE NASAL SPRAY 0.65 % NA SOLN
2.00 | NASAL | Status: DC
Start: ? — End: 2018-05-29

## 2018-05-29 MED ORDER — GENERIC EXTERNAL MEDICATION
25.00 | Status: DC
Start: ? — End: 2018-05-29

## 2018-05-29 MED ORDER — FERROUS SULFATE 325 (65 FE) MG PO TABS
325.00 | ORAL_TABLET | ORAL | Status: DC
Start: 2018-05-29 — End: 2018-05-29

## 2018-05-29 MED ORDER — SIMETHICONE 80 MG PO CHEW
80.00 | CHEWABLE_TABLET | ORAL | Status: DC
Start: ? — End: 2018-05-29

## 2018-05-29 MED ORDER — GUAIFENESIN 100 MG/5ML PO LIQD
200.00 | ORAL | Status: DC
Start: ? — End: 2018-05-29

## 2018-05-29 MED ORDER — GENERIC EXTERNAL MEDICATION
12.50 | Status: DC
Start: ? — End: 2018-05-29

## 2018-05-29 MED ORDER — MEASLES, MUMPS & RUBELLA VAC IJ SOLR
0.50 | INTRAMUSCULAR | Status: DC
Start: ? — End: 2018-05-29

## 2018-05-29 MED ORDER — PRENATAL 19 PO TABS
1.00 | ORAL_TABLET | ORAL | Status: DC
Start: 2018-05-30 — End: 2018-05-29

## 2018-05-29 MED ORDER — DOCUSATE SODIUM 100 MG PO CAPS
100.00 | ORAL_CAPSULE | ORAL | Status: DC
Start: 2018-05-29 — End: 2018-05-29

## 2018-05-29 MED ORDER — ACETAMINOPHEN 325 MG PO TABS
650.00 | ORAL_TABLET | ORAL | Status: DC
Start: ? — End: 2018-05-29

## 2018-05-29 MED ORDER — BENZOCAINE-MENTHOL 20-0.5 % EX AERO
INHALATION_SPRAY | CUTANEOUS | Status: DC
Start: ? — End: 2018-05-29

## 2018-05-29 MED ORDER — IBUPROFEN 100 MG/5ML PO SUSP
800.00 | ORAL | Status: DC
Start: 2018-05-29 — End: 2018-05-29

## 2018-05-29 MED ORDER — GENERIC EXTERNAL MEDICATION
4.00 | Status: DC
Start: ? — End: 2018-05-29

## 2018-05-29 MED ORDER — HYDROCODONE-ACETAMINOPHEN 5-325 MG PO TABS
1.00 | ORAL_TABLET | ORAL | Status: DC
Start: ? — End: 2018-05-29

## 2018-05-29 MED ORDER — LANOLIN EX OINT
TOPICAL_OINTMENT | CUTANEOUS | Status: DC
Start: ? — End: 2018-05-29

## 2018-05-29 MED ORDER — TETANUS-DIPHTH-ACELL PERTUSSIS 5-2-15.5 LF-MCG/0.5 IM SUSP
0.50 | INTRAMUSCULAR | Status: DC
Start: ? — End: 2018-05-29

## 2018-05-29 MED ORDER — ALUM & MAG HYDROXIDE-SIMETH 200-200-20 MG/5ML PO SUSP
30.00 | ORAL | Status: DC
Start: ? — End: 2018-05-29

## 2018-05-29 MED ORDER — LACTATED RINGERS IV SOLN
125.00 | INTRAVENOUS | Status: DC
Start: ? — End: 2018-05-29

## 2018-05-29 MED ORDER — HYDROCODONE-ACETAMINOPHEN 10-325 MG PO TABS
1.00 | ORAL_TABLET | ORAL | Status: DC
Start: ? — End: 2018-05-29

## 2018-05-29 MED ORDER — MORPHINE SULFATE (PF) 10 MG/ML IV SOLN
10.00 | INTRAVENOUS | Status: DC
Start: ? — End: 2018-05-29

## 2018-05-29 MED ORDER — MAGNESIUM HYDROXIDE 400 MG/5ML PO SUSP
30.00 | ORAL | Status: DC
Start: ? — End: 2018-05-29

## 2018-05-29 MED ORDER — MORPHINE SULFATE (PF) 4 MG/ML IV SOLN
4.00 | INTRAVENOUS | Status: DC
Start: ? — End: 2018-05-29

## 2018-05-29 MED ORDER — GENERIC EXTERNAL MEDICATION
Status: DC
Start: ? — End: 2018-05-29

## 2018-11-23 ENCOUNTER — Encounter (HOSPITAL_COMMUNITY): Payer: Self-pay

## 2020-08-12 ENCOUNTER — Encounter (HOSPITAL_COMMUNITY): Payer: Self-pay | Admitting: Obstetrics and Gynecology

## 2020-08-12 ENCOUNTER — Inpatient Hospital Stay (HOSPITAL_COMMUNITY): Payer: Medicaid Other

## 2020-08-12 ENCOUNTER — Other Ambulatory Visit: Payer: Self-pay

## 2020-08-12 ENCOUNTER — Inpatient Hospital Stay (HOSPITAL_COMMUNITY)
Admission: AD | Admit: 2020-08-12 | Discharge: 2020-08-12 | Disposition: A | Discharge status: Home / Self Care | Payer: Medicaid Other | Attending: Obstetrics and Gynecology | Admitting: Obstetrics and Gynecology

## 2020-08-12 DIAGNOSIS — O4690 Antepartum hemorrhage, unspecified, unspecified trimester: Secondary | ICD-10-CM

## 2020-08-12 DIAGNOSIS — Z3A01 Less than 8 weeks gestation of pregnancy: Secondary | ICD-10-CM | POA: Diagnosis not present

## 2020-08-12 DIAGNOSIS — O200 Threatened abortion: Secondary | ICD-10-CM | POA: Insufficient documentation

## 2020-08-12 DIAGNOSIS — Z6790 Unspecified blood type, Rh positive: Secondary | ICD-10-CM

## 2020-08-12 DIAGNOSIS — O209 Hemorrhage in early pregnancy, unspecified: Secondary | ICD-10-CM | POA: Diagnosis present

## 2020-08-12 DIAGNOSIS — Z3490 Encounter for supervision of normal pregnancy, unspecified, unspecified trimester: Secondary | ICD-10-CM

## 2020-08-12 DIAGNOSIS — Z880 Allergy status to penicillin: Secondary | ICD-10-CM | POA: Diagnosis not present

## 2020-08-12 LAB — COMPREHENSIVE METABOLIC PANEL
ALT: 9 U/L (ref 0–44)
AST: 15 U/L (ref 15–41)
Albumin: 3.9 g/dL (ref 3.5–5.0)
Alkaline Phosphatase: 69 U/L (ref 38–126)
Anion gap: 8 (ref 5–15)
BUN: 9 mg/dL (ref 6–20)
CO2: 26 mmol/L (ref 22–32)
Calcium: 9.5 mg/dL (ref 8.9–10.3)
Chloride: 103 mmol/L (ref 98–111)
Creatinine, Ser: 0.62 mg/dL (ref 0.44–1.00)
GFR, Estimated: >60 mL/min (ref >60)
Glucose, Bld: 90 mg/dL (ref 70–99)
Potassium: 4.3 mmol/L (ref 3.5–5.1)
Sodium: 137 mmol/L (ref 135–145)
Total Bilirubin: 0.5 mg/dL (ref 0.3–1.2)
Total Protein: 7.6 g/dL (ref 6.5–8.1)

## 2020-08-12 LAB — URINALYSIS, ROUTINE W REFLEX MICROSCOPIC
Bilirubin Urine: NEGATIVE
Glucose, UA: NEGATIVE mg/dL
Ketones, ur: 5 mg/dL — AB
Leukocytes,Ua: NEGATIVE
Nitrite: NEGATIVE
Protein, ur: 100 mg/dL — AB
RBC / HPF: >50 RBC/hpf — ABNORMAL HIGH (ref 0–5)
Specific Gravity, Urine: 1.019 (ref 1.005–1.030)
pH: 6 (ref 5.0–8.0)

## 2020-08-12 LAB — CBC
HCT: 35.8 % — ABNORMAL LOW (ref 36.0–46.0)
Hemoglobin: 11.7 g/dL — ABNORMAL LOW (ref 12.0–15.0)
MCH: 28.3 pg (ref 26.0–34.0)
MCHC: 32.7 g/dL (ref 30.0–36.0)
MCV: 86.5 fL (ref 80.0–100.0)
Platelets: 413 10*3/uL — ABNORMAL HIGH (ref 150–400)
RBC: 4.14 MIL/uL (ref 3.87–5.11)
RDW: 14.6 % (ref 11.5–15.5)
WBC: 6.9 10*3/uL (ref 4.0–10.5)
nRBC: 0 % (ref 0.0–0.2)

## 2020-08-12 LAB — WET PREP, GENITAL
Sperm: NONE SEEN
Trich, Wet Prep: NONE SEEN
Yeast Wet Prep HPF POC: NONE SEEN

## 2020-08-12 LAB — HCG, QUANTITATIVE, PREGNANCY: hCG, Beta Chain, Quant, S: 3588 m[IU]/mL — ABNORMAL HIGH (ref <5)

## 2020-08-12 NOTE — MAU Provider Note (Signed)
History     CSN: [id number]  Arrival date and time: 08/12/20 1644   Event Date/Time   First Provider Initiated Contact with Patient 08/12/20 1804      Chief Complaint  Patient presents with  . Vaginal Bleeding   Ms. Gail Wong is a 25 y.o. G2P0101 at [redacted]w[redacted]d who presents to MAU for vaginal bleeding which began about 3 days ago. Patient reports the first day it was only light pink spotting when wiping. The second day the bleeding was still light, but she had to wear a panty liner and changed it a couple of times that day. Patient reports today the bleeding was heavier and she was experiencing some clotting as well. Patient reports she also is having some cramping, but reports that the cramping is less than her normal periods. Patient was seen at [facility] to be evaluated for bleeding, but per [facility] note, there was so much blood that the cervix could not be visualized, so patient was sent by private vehicle to MAU for further evaluation.  Passing blood clots? yes Blood soaking clothes? no Lightheaded/dizzy? no Significant pelvic pain or cramping? no Passed any tissue? unsure  Pt denies vaginal discharge/odor/itching. Pt denies N/V, abdominal pain, constipation, diarrhea, or urinary problems. Pt denies fever, chills, fatigue, sweating or changes in appetite. Pt denies SOB or chest pain. Pt denies dizziness, HA, light-headedness, weakness.   OB History    Gravida  2   Para  1   Term      Preterm  1   AB      Living  1     SAB      IAB      Ectopic      Multiple      Live Births  1           Past Medical History:  Diagnosis Date  . Anemia    last Hgb 12.0 from 01/31/18 lab result under care everywhere ([facility])    Past Surgical History:  Procedure Laterality Date  . NO PAST SURGERIES      Family History  Problem Relation Age of Onset  . Diabetes Maternal Grandmother     Social History   Tobacco Use  . Smoking status: Never Smoker  . Smokeless  tobacco: Never Used  Vaping Use  . Vaping Use: Never used  Substance Use Topics  . Alcohol use: Never  . Drug use: Never    Allergies:  Allergies  Allergen Reactions  . Amoxicillin Nausea And Vomiting and Rash    Did it involve swelling of the face/tongue/throat, SOB, or low BP? No Did it involve sudden or severe rash/hives, skin peeling, or any reaction on the inside of your mouth or nose? Yes Did you need to seek medical attention at a hospital or doctor's office? No When did it last happen?has not had in last 10 years If all above answers are "NO", may proceed with cephalosporin use.   Tresa Res Oil Rash  . Pork-Derived Products Rash    HA and vomiting    No medications prior to admission.    Review of Systems  Constitutional: Negative for chills, diaphoresis, fatigue and fever.  Eyes: Negative for visual disturbance.  Respiratory: Negative for shortness of breath.   Cardiovascular: Negative for chest pain.  Gastrointestinal: Negative for abdominal pain, constipation, diarrhea, nausea and vomiting.  Genitourinary: Positive for pelvic pain and vaginal bleeding. Negative for dysuria, flank pain, frequency, urgency and vaginal discharge.  Neurological: Negative for  dizziness, weakness, light-headedness and headaches.   Physical Exam   Blood pressure 129/80, pulse 86, temperature 98.1 F (36.7 C), temperature source Oral, resp. rate 15, weight 43.5 kg, last menstrual period 07/02/2020, SpO2 100 %, unknown if currently breastfeeding.  Patient Vitals for the past 24 hrs:  BP Temp Temp src Pulse Resp SpO2 Weight  08/12/20 2015 129/80 -- -- 86 15 100 % --  08/12/20 1802 -- -- -- -- -- -- 43.5 kg  08/12/20 1725 116/83 -- -- 90 -- 100 % --  08/12/20 1720 118/90 98.1 F (36.7 C) Oral 87 15 100 % --   Physical Exam Vitals and nursing note reviewed. Exam conducted with a chaperone present.  Constitutional:      General: She is not in acute distress.    Appearance: She  is well-developed. She is not diaphoretic.  HENT:     Head: Normocephalic and atraumatic.  Pulmonary:     Effort: Pulmonary effort is normal.  Abdominal:     General: There is no distension.     Palpations: Abdomen is soft. There is no mass.     Tenderness: There is no abdominal tenderness. There is no guarding or rebound.  Genitourinary:    General: Normal vulva.     Labia:        Right: No rash, tenderness or lesion.        Left: No rash, tenderness or lesion.      Vagina: Bleeding present.     Cervix: Dilated. Cervical bleeding present.     Comments: Small-moderate amount of blood pooled in the vagina with what appear to be products of conception at cervical os. Blood easily cleared with 3 Fox swabs and suspected POCs easily removed with ring forceps and placed in specimen container to send to pathology. Skin:    General: Skin is warm and dry.  Neurological:     Mental Status: She is alert and oriented to person, place, and time.  Psychiatric:        Behavior: Behavior normal.        Thought Content: Thought content normal.        Judgment: Judgment normal.    Results for orders placed or performed during the hospital encounter of 08/12/20 (from the past 24 hour(s))  Urinalysis, Routine w reflex microscopic Urine, Clean Catch     Status: Abnormal   Collection Time: 08/12/20  5:17 PM  Result Value Ref Range   Color, Urine AMBER (A) YELLOW   APPearance CLOUDY (A) CLEAR   Specific Gravity, Urine 1.019 1.005 - 1.030   pH 6.0 5.0 - 8.0   Glucose, UA NEGATIVE NEGATIVE mg/dL   Hgb urine dipstick MODERATE (A) NEGATIVE   Bilirubin Urine NEGATIVE NEGATIVE   Ketones, ur 5 (A) NEGATIVE mg/dL   Protein, ur 989 (A) NEGATIVE mg/dL   Nitrite NEGATIVE NEGATIVE   Leukocytes,Ua NEGATIVE NEGATIVE   RBC / HPF >50 (H) 0 - 5 RBC/hpf   Bacteria, UA RARE (A) NONE SEEN   Squamous Epithelial / LPF 0-5 0 - 5   Mucus PRESENT   CBC     Status: Abnormal   Collection Time: 08/12/20  5:46 PM   Result Value Ref Range   WBC 6.9 4.0 - 10.5 K/uL   RBC 4.14 3.87 - 5.11 MIL/uL   Hemoglobin 11.7 (L) 12.0 - 15.0 g/dL   HCT 21.1 (L) 94.1 - 74.0 %   MCV 86.5 80.0 - 100.0 fL   MCH 28.3 26.0 -  34.0 pg   MCHC 32.7 30.0 - 36.0 g/dL   RDW 11.914.6 14.711.5 - 82.915.5 %   Platelets 413 (H) 150 - 400 K/uL   nRBC 0.0 0.0 - 0.2 %  Comprehensive metabolic panel     Status: None   Collection Time: 08/12/20  5:46 PM  Result Value Ref Range   Sodium 137 135 - 145 mmol/L   Potassium 4.3 3.5 - 5.1 mmol/L   Chloride 103 98 - 111 mmol/L   CO2 26 22 - 32 mmol/L   Glucose, Bld 90 70 - 99 mg/dL   BUN 9 6 - 20 mg/dL   Creatinine, Ser 5.620.62 0.44 - 1.00 mg/dL   Calcium 9.5 8.9 - 13.010.3 mg/dL   Total Protein 7.6 6.5 - 8.1 g/dL   Albumin 3.9 3.5 - 5.0 g/dL   AST 15 15 - 41 U/L   ALT 9 0 - 44 U/L   Alkaline Phosphatase 69 38 - 126 U/L   Total Bilirubin 0.5 0.3 - 1.2 mg/dL   GFR, Estimated >86>60 >57>60 mL/min   Anion gap 8 5 - 15  hCG, quantitative, pregnancy     Status: Abnormal   Collection Time: 08/12/20  5:46 PM  Result Value Ref Range   hCG, Beta Chain, Quant, S 3,588 (H) <5 mIU/mL  Wet prep, genital     Status: Abnormal   Collection Time: 08/12/20  6:16 PM   Specimen: Vaginal  Result Value Ref Range   Yeast Wet Prep HPF POC NONE SEEN NONE SEEN   Trich, Wet Prep NONE SEEN NONE SEEN   Clue Cells Wet Prep HPF POC PRESENT (A) NONE SEEN   WBC, Wet Prep HPF POC FEW (A) NONE SEEN   Sperm NONE SEEN    US OB LESS THAN 14 WEEKS WITH OB TRANSVAGINAL  Result Date: 08/12/2020 CLINICAL DATA:  Vaginal bleeding and cramping for 3 days. EXAM: OBSTETRIC <14 WK US AND TRANSVAGINAL OB US TECHNIQUE: Both transabdominal and transvaginal ultrasound examinations were performed for complete evaluation of the gestation as well as the maternal uterus, adnexal regions, and pelvic cul-de-sac. Transvaginal technique was performed to assess early pregnancy. COMPARISON:  None. FINDINGS: Intrauterine gestational sac: Single, with small size  and mildly irregular shape Yolk sac:  Not Visualized. Embryo:  Visualized. Cardiac Activity: Not Visualized. CRL:  3 mm   5 w   6 d Subchorionic hemorrhage:  None visualized. Maternal uterus/adnexae: Both ovaries are normal appearance. No mass or abnormal free fluid identified. IMPRESSION: Findings are suspicious but not yet definitive for failed pregnancy. Recommend follow-up US in 7 days for definitive diagnosis. This recommendation follows SRU consensus guidelines: Diagnostic Criteria for Nonviable Pregnancy Early in the First Trimester. Malva Limes Engl J Med 2013; 846:9629-52; 369:1443-51. Electronically Signed   By: Danae OrleansJohn A Stahl M.D.   On: 08/12/2020 19:21   MAU Course  Procedures  MDM -r/o ectopic, suspect SAB in progress -UA: amber/cloudy/mod hgb/5ketones/100PRO/rare bacteria, sending urine for culture -CBC: no abnormalities requiring treatment -CMP: WNL -US: single IUP, sm size, mildly irregular shape, +embryo, 8242w6d, suspicious but not yet definitive for failed pregnancy -hCG: 3,588 -ABO: O Positive -WetPrep: +ClueCells (isolated finding not requiring treatment) -GC/CT collected -pt discharged to home in stable condition  Orders Placed This Encounter  Procedures  . Wet prep, genital    Standing Status:   Standing    Number of Occurrences:   1  . Culture, OB Urine    Standing Status:   Standing    Number of Occurrences:  1  . US OB LESS THAN 14 WEEKS WITH OB TRANSVAGINAL    Standing Status:   Standing    Number of Occurrences:   1    Order Specific Question:   Symptom/Reason for Exam    Answer:   Vaginal bleeding in pregnancy [705036]  . US OB LESS THAN 14 WEEKS WITH OB TRANSVAGINAL    Standing Status:   Future    Standing Expiration Date:   08/12/2021    Order Specific Question:   Reason for Exam (SYMPTOM  OR DIAGNOSIS REQUIRED)    Answer:   viability    Order Specific Question:   Preferred Imaging Location?    Answer:   [facility]  . Urinalysis, Routine w reflex microscopic Urine, Clean  Catch    Standing Status:   Standing    Number of Occurrences:   1  . CBC    Standing Status:   Standing    Number of Occurrences:   1  . Comprehensive metabolic panel    Standing Status:   Standing    Number of Occurrences:   1  . hCG, quantitative, pregnancy    Standing Status:   Standing    Number of Occurrences:   1  . Discharge patient    Order Specific Question:   Discharge disposition    Answer:   01-Home or Self Care [1]    Order Specific Question:   Discharge patient date    Answer:   08/12/2020   No orders of the defined types were placed in this encounter.  Assessment and Plan   1. Intrauterine pregnancy   2. Vaginal bleeding in pregnancy   3. [redacted] weeks gestation of pregnancy   4. Threatened miscarriage   5. Blood type, Rh positive     Allergies as of 08/12/2020      Reactions   Amoxicillin Nausea And Vomiting, Rash   Did it involve swelling of the face/tongue/throat, SOB, or low BP? No Did it involve sudden or severe rash/hives, skin peeling, or any reaction on the inside of your mouth or nose? Yes Did you need to seek medical attention at a hospital or doctor's office? No When did it last happen?has not had in last 10 years If all above answers are "NO", may proceed with cephalosporin use.   Lavender Oil Rash   Pork-derived Products Rash   HA and vomiting      Medication List    TAKE these medications   folic acid 1 MG tablet Commonly known as: FOLVITE Take 1 mg by mouth daily.   prenatal multivitamin Tabs tablet Take 1 tablet by mouth daily at 12 noon.   Ricola Lozg Use as directed 1 each in the mouth or throat every 2 (two) hours as needed (sore throat).      -will call with culture results, if positive -SAB precautions given -f/u US in 10-24 days -return MAU precautions given -pt discharged to home in stable condition  Joni Reining E Dachelle Molzahn 08/12/2020, 8:39 PM

## 2020-08-12 NOTE — MAU Note (Addendum)
.   Gail Wong is a 25 y.o. at [redacted]w[redacted]d here in MAU reporting: vaginal bleeding for the last three days. She states that it started light where she was just wearing panty liners but today she had to wear a pad and there was bright red bleeding. She states she just passed a golf ball sized clot in the bathroom. Is having lower abdominal cramping she rates 5/10. Last IC was last week.  LMP: 07/02/20 Onset of complaint: 3 days ago Pain score: 5    Lab orders placed from triage: UA

## 2020-08-12 NOTE — Discharge Instructions (Signed)
Miscarriage A miscarriage is the loss of pregnancy before the 20th week. Most miscarriages happen during the first 3 months of pregnancy. Sometimes, a miscarriage can happen before a woman knows that she is pregnant. Having a miscarriage can be an emotional experience. If you have had a miscarriage, talk with your health care provider about any questions you may have about the loss of your baby, the grieving process, and your plans for future pregnancy. What are the causes? Many times, the cause of a miscarriage is not known. What increases the risk? The following factors may make a pregnant woman more likely to have a miscarriage: Certain medical conditions  Conditions that affect the hormone balance in the body, such as thyroid disease or polycystic ovary syndrome.  Diabetes.  Autoimmune disorders.  Infections.  Bleeding disorders.  Obesity. Lifestyle factors  Using products with tobacco or nicotine in them or being exposed to tobacco smoke.  Having alcohol.  Having large amounts of caffeine.  Recreational drug use. Problems with reproductive organs or structures  Cervical insufficiency. This is when the lowest part of the uterus (cervix) opens and thins before pregnancy is at term.  Having a condition called Asherman syndrome. This syndrome causes scarring in the uterus or causes the uterus to be abnormal in structure.  Fibrous growths, called fibroids, in the uterus.  Congenital abnormalities. These problems are present at birth.  Infection of the cervix or uterus. Personal or medical history  Injury (trauma).  Having had a miscarriage before.  Being younger than age 18 or older than age 35.  Exposure to harmful substances in the environment. This may include radiation or heavy metals, such as lead.  Use of certain medicines. What are the signs or symptoms? Symptoms of this condition include:  Vaginal bleeding or spotting, with or without cramps or  pain.  Pain or cramping in the abdomen or lower back.  Fluid or tissue coming out of the vagina. How is this diagnosed? This condition may be diagnosed based on:  A physical exam.  Ultrasound.  Lab tests, such as blood tests, urine tests, or swabs for infection. How is this treated? Treatment for a miscarriage is sometimes not needed if all the pregnancy tissue that was in the uterus comes out on its own, and there are no other problems such as infection or heavy bleeding. In other cases, this condition may be treated with:  Dilation and curettage (D&C). In this procedure, the cervix is stretched open and any remaining pregnancy tissue is removed from the lining of the uterus (endometrium).  Medicines. These may include: ? Antibiotic medicine, to treat infection. ? Medicine to help any remaining pregnancy tissue come out of the body. ? Medicine to reduce (contract) the size of the uterus. These medicines may be given if there is a lot of bleeding. If you have Rh-negative blood, you may be given an injection of a medicine called Rho(D) immune globulin. This medicine helps prevent problems with future pregnancies. Follow these instructions at home: Medicines  Take over-the-counter and prescription medicines only as told by your health care provider.  If you were prescribed antibiotic medicine, take it as told by your health care provider. Do not stop taking the antibiotic even if you start to feel better. Activity  Rest as told by your health care provider. Ask your health care provider what activities are safe for you.  Have someone help with home and family responsibilities during this time. General instructions  Monitor how much tissue   or blood clot material comes out of the vagina.  Do not have sex, douche, or put anything, such as tampons, in your vagina until your health care provider says it is okay.  To help you and your partner with the grieving process, talk with your  health care provider or get counseling.  When you are ready, meet with your health care provider to discuss any important steps you should take for your health. Also, discuss steps you should take to have a healthy pregnancy in the future.  Keep all follow-up visits. This is important.   Where to find more information  The [employer] of [employer]ians [employer]necologists: acog.org  [place] Department of Health and Cytogeneticist of Women's Health: http://hoffman.com/ Contact a health care provider if:  You have a fever or chills.  There is bad-smelling fluid coming from the vagina.  You have more bleeding instead of less.  Tissue or blood clots come out of your vagina. Get help right away if:  You have severe cramps or pain in your back or abdomen.  Heavy bleeding soaks through 2 large sanitary pads an hour for more than 2 hours.  You become light-headed or weak.  You faint.  You feel sad, and your sadness takes over your thoughts.  You think about hurting yourself. If you ever feel like you may hurt yourself or others, or have thoughts about taking your own life, get help right away. Go to your nearest emergency department or:  Call your local emergency services (911 in the [place]  Call a suicide crisis helpline, such as the National Suicide Prevention Lifeline at (718) 670-4877. This is open 24 hours a day in the [place]  Text the Crisis Text Line at (330) 267-2900 (in the [place] Summary  Most miscarriages happen in the first 3 months of pregnancy. Sometimes miscarriage happens before a woman knows that she is pregnant.  Follow instructions from your health care provider about medicines and activity.  To help you and your partner with grieving, talk with your health care provider or get counseling.  Keep all follow-up visits. This information is not intended to replace advice given to you by your health care provider. Make sure you discuss any questions you  have with your health care provider. Document Revised: 10/05/2019 Document Reviewed: 10/05/2019 Elsevier Patient Education  2021 Elsevier Inc.        Vaginal Bleeding During Pregnancy, First Trimester A small amount of bleeding from the vagina, or spotting, is common during early pregnancy. Some bleeding may be related to the pregnancy, and some may not. In many cases, the bleeding is normal and is not a problem. However, bleeding can also be a sign of something serious. Normal things that may cause bleeding during the first trimester:  Implantation of the fertilized egg in the lining of the uterus.  Rapid changes in blood vessels. This is caused by changes that are happening to the body during pregnancy.  Sex.  Pelvic exams. Abnormal things that may cause bleeding during the first trimester include:  Infection or inflammation of the cervix.  Growths or polyps on the cervix.  Miscarriage or threatened miscarriage.  Pregnancy that is growing outside of the uterus (ectopic pregnancy).  A fertilized egg that becomes a mass of tissue (molar pregnancy). Tell your health care provider right away if there is any bleeding from your vagina. Follow these instructions at home: Monitoring your bleeding Monitor your bleeding.  Pay attention to any changes in your symptoms. Let  your health care provider know about any concerns.  Try to understand when the bleeding occurs. Does the bleeding start on its own, or does it start after something is done, such as sex or a pelvic exam?  Use a diary to record the things you see about your bleeding, including: ? The kind of bleeding you are having. Does the bleeding start and stop irregularly, or is it a constant flow? ? The severity of your bleeding. Is the bleeding heavy or light? ? The number of pads you use each day, how often you change them, and how soaked they are.  Tell your health care provider if you pass tissue. He or she may want  to see it.   Activity  Follow instructions from your health care provider about limiting your activity. Ask what activities are safe for you.  Do not have sex until your health care provider says that this is safe.  If needed, make plans for someone to help with your regular activities. General instructions  Take over-the-counter and prescription medicines only as told by your health care provider.  Do not take aspirin because it can cause bleeding.  Do not use tampons or douche.  Keep all follow-up visits. This is important. Contact a health care provider if:  You have vaginal bleeding during any part of your pregnancy.  You have cramps or labor pains.  You have a fever or chills. Get help right away if:  You have severe cramps in your back or abdomen.  You pass large clots or a large amount of tissue from your vagina.  Your bleeding increases.  You feel light-headed or weak, or you faint.  You are leaking fluid or have a gush of fluid from your vagina. Summary  A small amount of bleeding from the vagina is common during early pregnancy.  Be sure to tell your health care provider about any vaginal bleeding right away.  Try to understand when bleeding occurs. Does bleeding occur on its own, or does it occur after something is done, such as sex or pelvic exams?  Keep all follow-up visits. This is important. This information is not intended to replace advice given to you by your health care provider. Make sure you discuss any questions you have with your health care provider. Document Revised: 12/27/2019 Document Reviewed: 12/27/2019 Elsevier Patient Education  2021 [employer].

## 2020-08-13 LAB — GC/CHLAMYDIA PROBE AMP (~~LOC~~) NOT AT ARMC
Chlamydia: NEGATIVE
Comment: NEGATIVE
Comment: NORMAL
Neisseria Gonorrhea: NEGATIVE

## 2020-08-14 LAB — CULTURE, OB URINE: Culture: NO GROWTH

## 2020-08-14 LAB — SURGICAL PATHOLOGY

## 2020-09-04 ENCOUNTER — Ambulatory Visit: Admission: RE | Admit: 2020-09-04 | Payer: Medicaid Other | Source: Ambulatory Visit

## 2021-05-04 IMAGING — US US OB < 14 WEEKS - US OB TV
1 series · 15 of 28 positions shown · non-contrast
Comparison: None.

CLINICAL DATA: Vaginal bleeding and cramping for 3 days.

EXAM:
OBSTETRIC <14 WK US AND TRANSVAGINAL OB US
TECHNIQUE: Both transabdominal and transvaginal ultrasound examinations were
performed for complete evaluation of the gestation as well as the
maternal uterus, adnexal regions, and pelvic cul-de-sac.
Transvaginal technique was performed to assess early pregnancy.

[Series 1: us ob < 14 weeks - us ob tv · 51 acquisitions, 15 frames shown]
[im 1/51]
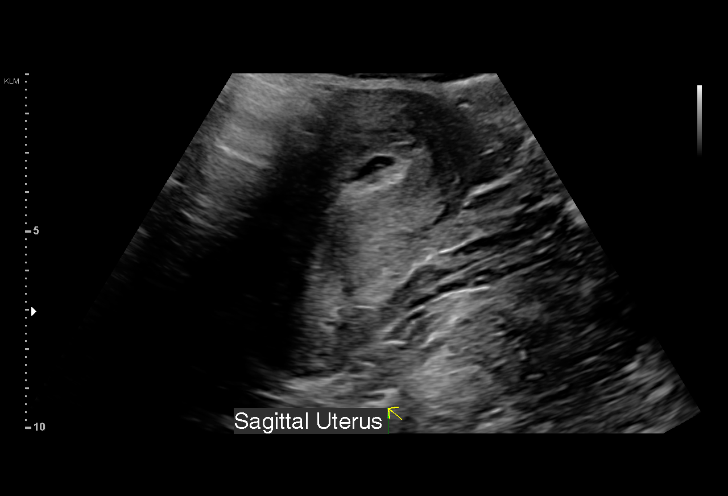
[im 4/51]
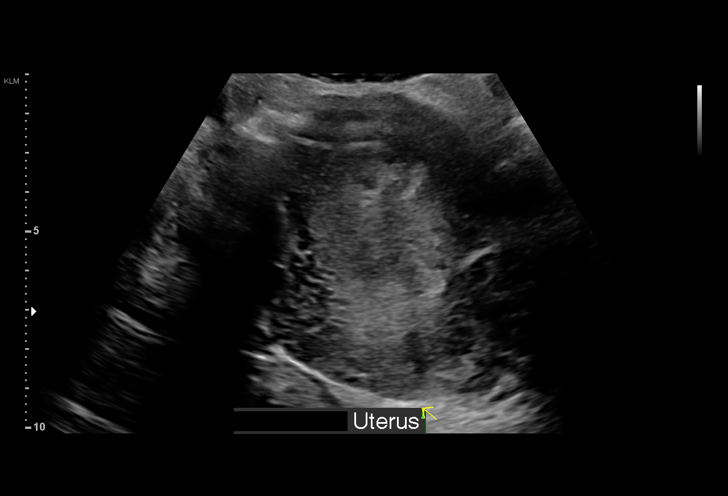
[im 8/51]
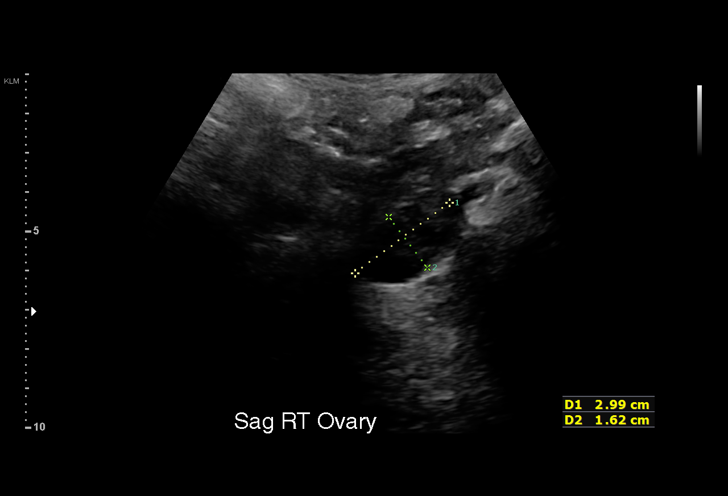
[im 12/51]
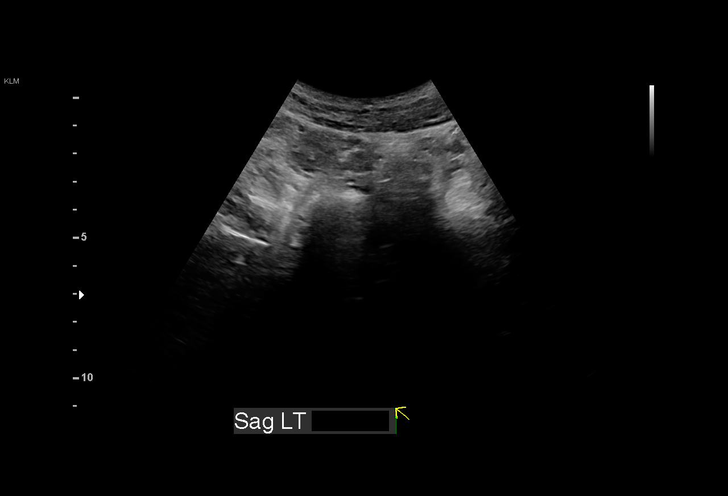
[im 15/51]
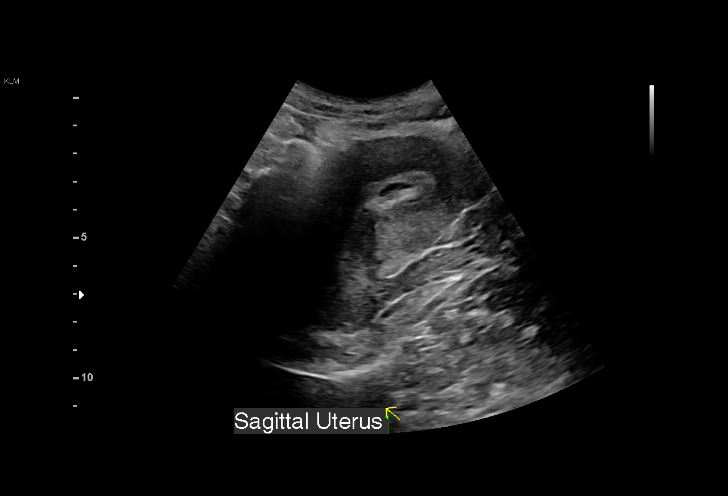
[im 19/51]
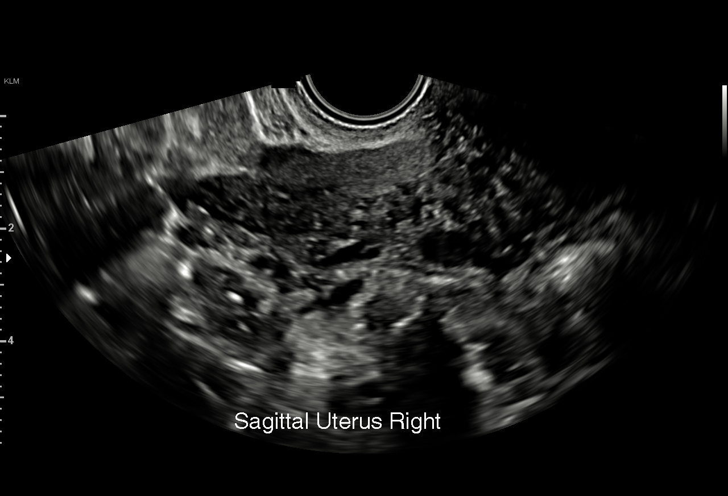
[im 23/51]
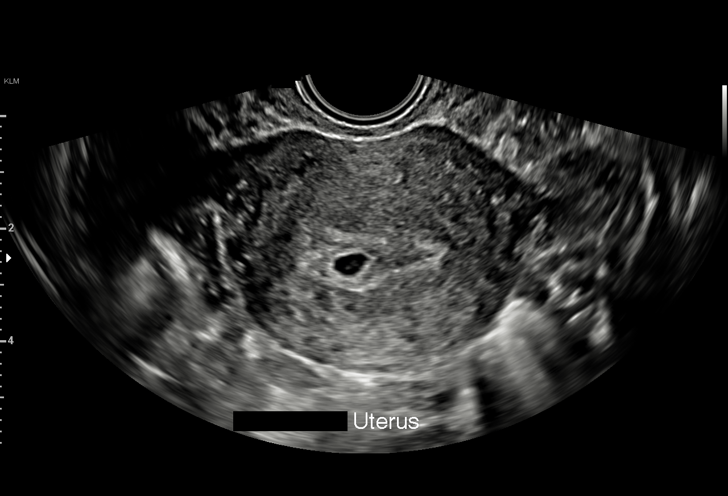
[im 26/51]
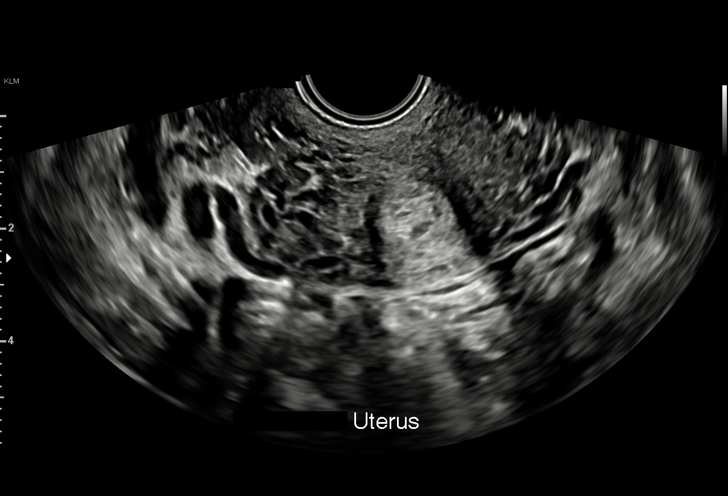
[im 28/51]
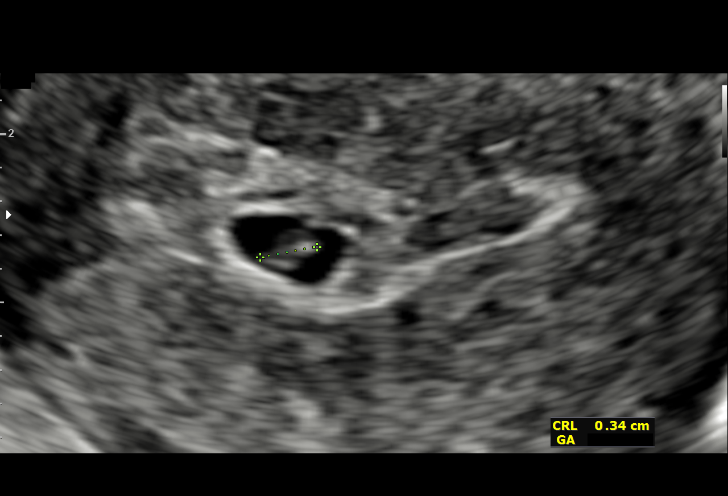
[im 32/51]
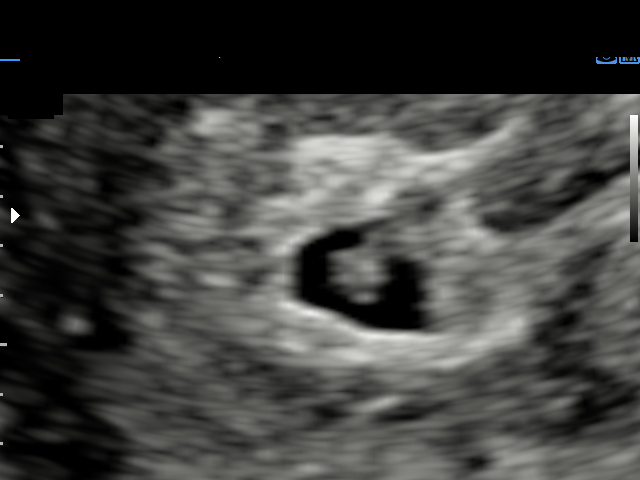
[im 36/51]
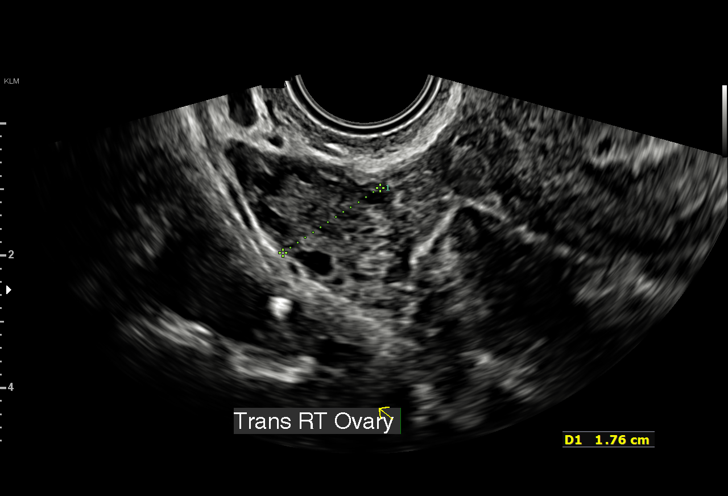
[im 39/51]
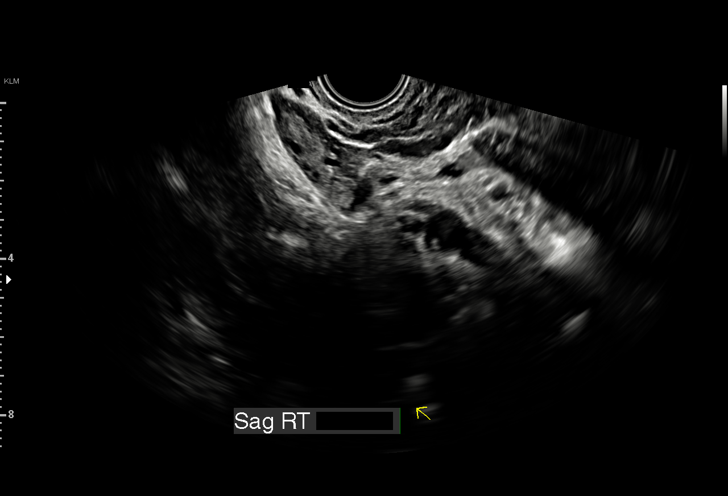
[im 43/51]
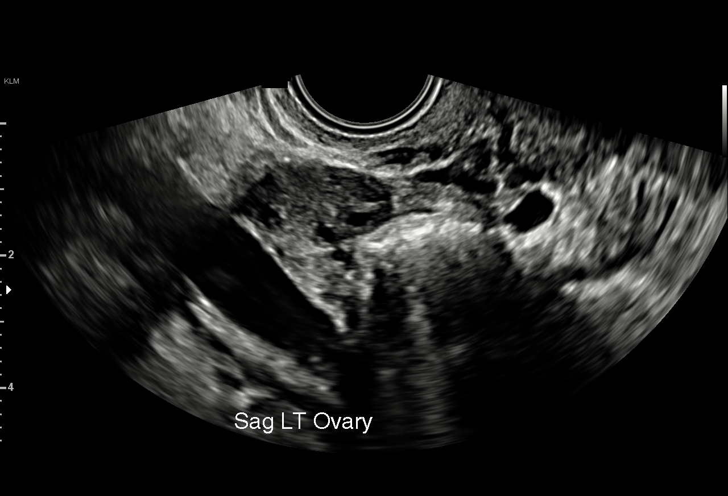
[im 47/51]
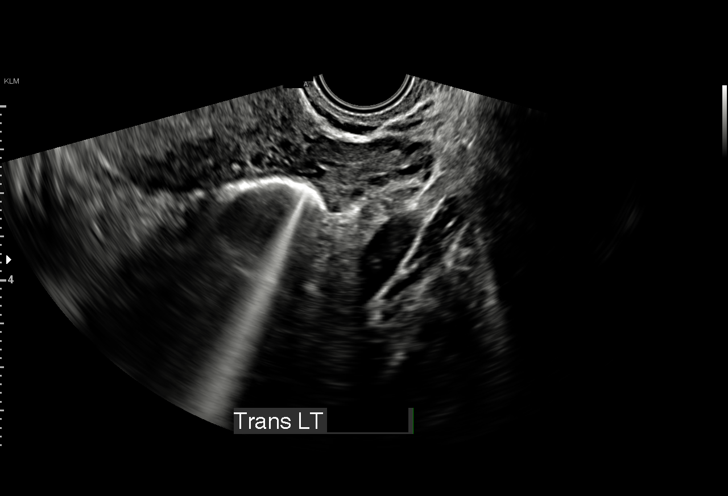
[im 51/51]
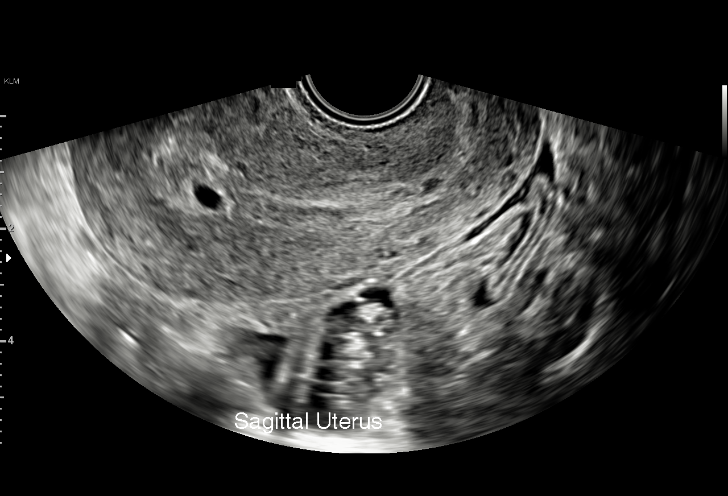

[15 of 28 positions shown; findings below may reference images not displayed]

FINDINGS: Intrauterine gestational sac: Single, with small size and mildly
irregular shape

Yolk sac:  Not Visualized.

Embryo:  Visualized.

Cardiac Activity: Not Visualized.

CRL:  3 mm   5 w   6 d

Subchorionic hemorrhage:  None visualized.

Maternal uterus/adnexae: Both ovaries are normal appearance. No mass
or abnormal free fluid identified.
IMPRESSION: Findings are suspicious but not yet definitive for failed pregnancy.
Recommend follow-up US in 7 days for definitive diagnosis. This
recommendation follows SRU consensus guidelines: Diagnostic Criteria
for Nonviable Pregnancy Early in the First Trimester. N Engl J Med

## 2023-02-18 ENCOUNTER — Emergency Department (HOSPITAL_BASED_OUTPATIENT_CLINIC_OR_DEPARTMENT_OTHER)
Admission: EM | Admit: 2023-02-18 | Discharge: 2023-02-18 | Disposition: A | Discharge status: Home / Self Care | Payer: Medicaid Other | Attending: Emergency Medicine | Admitting: Emergency Medicine

## 2023-02-18 ENCOUNTER — Encounter (HOSPITAL_BASED_OUTPATIENT_CLINIC_OR_DEPARTMENT_OTHER): Payer: Self-pay

## 2023-02-18 ENCOUNTER — Inpatient Hospital Stay (HOSPITAL_COMMUNITY)
Admission: AD | Admit: 2023-02-18 | Discharge: 2023-02-19 | Disposition: A | Discharge status: Home / Self Care | Payer: Medicaid Other | Source: Home / Self Care | Attending: Obstetrics and Gynecology | Admitting: Obstetrics and Gynecology

## 2023-02-18 ENCOUNTER — Other Ambulatory Visit: Payer: Self-pay

## 2023-02-18 DIAGNOSIS — O3680X0 Pregnancy with inconclusive fetal viability, not applicable or unspecified: Secondary | ICD-10-CM

## 2023-02-18 DIAGNOSIS — Z3A01 Less than 8 weeks gestation of pregnancy: Secondary | ICD-10-CM | POA: Insufficient documentation

## 2023-02-18 DIAGNOSIS — O209 Hemorrhage in early pregnancy, unspecified: Secondary | ICD-10-CM | POA: Insufficient documentation

## 2023-02-18 DIAGNOSIS — O200 Threatened abortion: Secondary | ICD-10-CM | POA: Diagnosis not present

## 2023-02-18 DIAGNOSIS — Z3A00 Weeks of gestation of pregnancy not specified: Secondary | ICD-10-CM | POA: Diagnosis not present

## 2023-02-18 LAB — URINALYSIS, ROUTINE W REFLEX MICROSCOPIC
Bacteria, UA: NONE SEEN
Bilirubin Urine: NEGATIVE
Glucose, UA: NEGATIVE mg/dL
Ketones, ur: NEGATIVE mg/dL
Leukocytes,Ua: NEGATIVE
Nitrite: NEGATIVE
Specific Gravity, Urine: 1.025 (ref 1.005–1.030)
pH: 8 (ref 5.0–8.0)

## 2023-02-18 LAB — CBC WITH DIFFERENTIAL/PLATELET
Abs Immature Granulocytes: 0.01 10*3/uL (ref 0.00–0.07)
Basophils Absolute: 0 10*3/uL (ref 0.0–0.1)
Basophils Relative: 1 %
Eosinophils Absolute: 0.2 10*3/uL (ref 0.0–0.5)
Eosinophils Relative: 3 %
HCT: 35.5 % — ABNORMAL LOW (ref 36.0–46.0)
Hemoglobin: 11.5 g/dL — ABNORMAL LOW (ref 12.0–15.0)
Immature Granulocytes: 0 %
Lymphocytes Relative: 53 %
Lymphs Abs: 2.8 10*3/uL (ref 0.7–4.0)
MCH: 26.5 pg (ref 26.0–34.0)
MCHC: 32.4 g/dL (ref 30.0–36.0)
MCV: 81.8 fL (ref 80.0–100.0)
Monocytes Absolute: 0.4 10*3/uL (ref 0.1–1.0)
Monocytes Relative: 7 %
Neutro Abs: 1.9 10*3/uL (ref 1.7–7.7)
Neutrophils Relative %: 36 %
Platelets: 394 10*3/uL (ref 150–400)
RBC: 4.34 MIL/uL (ref 3.87–5.11)
RDW: 17.2 % — ABNORMAL HIGH (ref 11.5–15.5)
WBC: 5.4 10*3/uL (ref 4.0–10.5)
nRBC: 0 % (ref 0.0–0.2)

## 2023-02-18 LAB — BASIC METABOLIC PANEL
Anion gap: 6 (ref 5–15)
BUN: 10 mg/dL (ref 6–20)
CO2: 28 mmol/L (ref 22–32)
Calcium: 9.4 mg/dL (ref 8.9–10.3)
Chloride: 104 mmol/L (ref 98–111)
Creatinine, Ser: 0.72 mg/dL (ref 0.44–1.00)
GFR, Estimated: >60 mL/min (ref >60)
Glucose, Bld: 107 mg/dL — ABNORMAL HIGH (ref 70–99)
Potassium: 3.6 mmol/L (ref 3.5–5.1)
Sodium: 138 mmol/L (ref 135–145)

## 2023-02-18 LAB — HCG, QUANTITATIVE, PREGNANCY: hCG, Beta Chain, Quant, S: 46 m[IU]/mL — ABNORMAL HIGH (ref <5)

## 2023-02-18 LAB — HCG, SERUM, QUALITATIVE: Preg, Serum: POSITIVE — AB

## 2023-02-18 NOTE — ED Provider Notes (Signed)
[place] EMERGENCY DEPARTMENT AT [facility] Provider Note   CSN: [id number] Arrival date & time: 02/18/23  2136     History  Chief Complaint  Patient presents with   Vaginal Bleeding    Gail Wong is a 27 y.o. female.  Patient to ED with vaginal bleeding for the past 13 days after what she felt started as a normal period. She reports left sided abdominal cramping about 2 days ago and since the bleeding has increased. She also took a pregnancy test 2 days ago which she reports as positive. No vaginal discharge, fever, nausea, vomiting.   The history is provided by the patient. No language interpreter was used.  Vaginal Bleeding      Home Medications Prior to Admission medications   Medication Sig Start Date End Date Taking? Authorizing Provider  folic acid (FOLVITE) 1 MG tablet Take 1 mg by mouth daily.    [provider]  Menthol (RICOLA) LOZG Use as directed 1 each in the mouth or throat every 2 (two) hours as needed (sore throat).    [provider]  Prenatal Vit-Fe Fumarate-FA (PRENATAL MULTIVITAMIN) TABS tablet Take 1 tablet by mouth daily at 12 noon.    [provider]      Allergies    Amoxicillin, Lavender oil, and Pork-derived products    Review of Systems   Review of Systems  Genitourinary:  Positive for vaginal bleeding.    Physical Exam Updated Vital Signs BP (!) 117/95 (BP Location: Right Arm)   Pulse 85   Temp 98.5 F (36.9 C) (Oral)   Resp 18   Ht 4\' 11"  (1.499 m)   Wt 45.4 kg   LMP 02/04/2023 (Exact Date)   SpO2 100%   BMI 20.20 kg/m  Physical Exam Vitals and nursing note reviewed.  Constitutional:      General: She is not in acute distress.    Appearance: Normal appearance.  Cardiovascular:     Rate and Rhythm: Normal rate.  Pulmonary:     Effort: Pulmonary effort is normal.  Abdominal:     General: There is no distension.     Palpations: Abdomen is soft.     Tenderness: There is no abdominal  tenderness.  Musculoskeletal:        General: Normal range of motion.     Cervical back: Normal range of motion and neck supple.  Skin:    General: Skin is warm and dry.     Coloration: Skin is not pale.  Neurological:     Mental Status: She is alert and oriented to person, place, and time.     ED Results / Procedures / Treatments   Labs (all labs ordered are listed, but only abnormal results are displayed) Labs Reviewed  CBC WITH DIFFERENTIAL/PLATELET - Abnormal; Notable for the following components:      Result Value   Hemoglobin 11.5 (*)    HCT 35.5 (*)    RDW 17.2 (*)    All other components within normal limits  HCG, SERUM, QUALITATIVE - Abnormal; Notable for the following components:   Preg, Serum POSITIVE (*)    All other components within normal limits  BASIC METABOLIC PANEL - Abnormal; Notable for the following components:   Glucose, Bld 107 (*)    All other components within normal limits  URINALYSIS, ROUTINE W REFLEX MICROSCOPIC - Abnormal; Notable for the following components:   APPearance HAZY (*)    Hgb urine dipstick MODERATE (*)    Protein,  ur TRACE (*)    All other components within normal limits  HCG, QUANTITATIVE, PREGNANCY   Results for orders placed or performed during the hospital encounter of 02/18/23  CBC with Differential  Result Value Ref Range   WBC 5.4 4.0 - 10.5 K/uL   RBC 4.34 3.87 - 5.11 MIL/uL   Hemoglobin 11.5 (L) 12.0 - 15.0 g/dL   HCT 16.1 (L) 09.6 - 04.5 %   MCV 81.8 80.0 - 100.0 fL   MCH 26.5 26.0 - 34.0 pg   MCHC 32.4 30.0 - 36.0 g/dL   RDW 40.9 (H) 81.1 - 91.4 %   Platelets 394 150 - 400 K/uL   nRBC 0.0 0.0 - 0.2 %   Neutrophils Relative % 36 %   Neutro Abs 1.9 1.7 - 7.7 K/uL   Lymphocytes Relative 53 %   Lymphs Abs 2.8 0.7 - 4.0 K/uL   Monocytes Relative 7 %   Monocytes Absolute 0.4 0.1 - 1.0 K/uL   Eosinophils Relative 3 %   Eosinophils Absolute 0.2 0.0 - 0.5 K/uL   Basophils Relative 1 %   Basophils Absolute 0.0 0.0 -  0.1 K/uL   Immature Granulocytes 0 %   Abs Immature Granulocytes 0.01 0.00 - 0.07 K/uL  Pregnancy serum, qualitative  Result Value Ref Range   Preg, Serum POSITIVE (A) NEGATIVE  Basic metabolic panel  Result Value Ref Range   Sodium 138 135 - 145 mmol/L   Potassium 3.6 3.5 - 5.1 mmol/L   Chloride 104 98 - 111 mmol/L   CO2 28 22 - 32 mmol/L   Glucose, Bld 107 (H) 70 - 99 mg/dL   BUN 10 6 - 20 mg/dL   Creatinine, Ser 7.82 0.44 - 1.00 mg/dL   Calcium 9.4 8.9 - 95.6 mg/dL   GFR, Estimated >21 >30 mL/min   Anion gap 6 5 - 15  Urinalysis, Routine w reflex microscopic -Urine, Clean Catch  Result Value Ref Range   Color, Urine YELLOW YELLOW   APPearance HAZY (A) CLEAR   Specific Gravity, Urine 1.025 1.005 - 1.030   pH 8.0 5.0 - 8.0   Glucose, UA NEGATIVE NEGATIVE mg/dL   Hgb urine dipstick MODERATE (A) NEGATIVE   Bilirubin Urine NEGATIVE NEGATIVE   Ketones, ur NEGATIVE NEGATIVE mg/dL   Protein, ur TRACE (A) NEGATIVE mg/dL   Nitrite NEGATIVE NEGATIVE   Leukocytes,Ua NEGATIVE NEGATIVE   RBC / HPF 0-5 0 - 5 RBC/hpf   WBC, UA 0-5 0 - 5 WBC/hpf   Bacteria, UA NONE SEEN NONE SEEN   Squamous Epithelial / HPF 0-5 0 - 5 /HPF   Mucus PRESENT    Amorphous Crystal PRESENT     EKG None  Radiology No results found.  Procedures Procedures    Medications Ordered in ED Medications - No data to display  ED Course/ Medical Decision Making/ A&P                                 Medical Decision Making This patient presents to the ED for concern of vaginal bleeding, +pregnancy test, this involves an extensive number of treatment options, and is a complaint that carries with it a high risk of complications and morbidity.  The differential diagnosis includes threatened miscarriage, ectopic pregnancy, incomplete abortion   Co morbidities that complicate the patient evaluation  N/A   Additional history obtained:  Lab Tests:  I Ordered, and personally interpreted labs.  The pertinent  results include:  Hgb 11.5, no electrolyte abnormalities. Serum pregnancy positive. Quant pending.    Imaging Studies ordered:  I ordered imaging studies including Ultrasound ordered but not available at this time     Consultations Obtained:  I requested consultation with the MAU provider, Misty Stanley,  and discussed lab and imaging findings as well as pertinent plan - they recommend: patient needs to come to [facility] for Ultrasound and to complete work up.   Problem List / ED Course / Critical interventions / Medication management  +pregnancy with vaginal bleeding - will need further work up unavailable at [facility].     Test / Admission - Considered:  Patient to POV transfer to [facility]. This was discussed with the patient who agrees and is felt reliable to leave by private vehicle and go directly to [facility].    Amount and/or Complexity of Data Reviewed Labs: ordered. Radiology: ordered.           Final Clinical Impression(s) / ED Diagnoses Final diagnoses:  Threatened miscarriage    Rx / DC Orders ED Discharge Orders     None         Danne Harbor 02/18/23 2332    Terrilee Files, MD 02/19/23 [phone]

## 2023-02-18 NOTE — ED Notes (Signed)
Patient's IV wrapped with gauze, patient instructed to go directly to MAU, states understanding. MAU notified of patient going by POV.

## 2023-02-18 NOTE — ED Triage Notes (Signed)
Pt started her period as normal and gave a couple of extra days to clear up. The cycle continued and on day 11 she took a pregnancy test which showed positive. Now she is on day 13 and is still cramping and having heavy bleeding.

## 2023-02-19 ENCOUNTER — Inpatient Hospital Stay (HOSPITAL_COMMUNITY): Payer: Medicaid Other

## 2023-02-19 ENCOUNTER — Encounter (HOSPITAL_COMMUNITY): Payer: Self-pay

## 2023-02-19 DIAGNOSIS — O3680X0 Pregnancy with inconclusive fetal viability, not applicable or unspecified: Secondary | ICD-10-CM

## 2023-02-19 DIAGNOSIS — Z3A01 Less than 8 weeks gestation of pregnancy: Secondary | ICD-10-CM | POA: Diagnosis not present

## 2023-02-19 DIAGNOSIS — O209 Hemorrhage in early pregnancy, unspecified: Secondary | ICD-10-CM | POA: Diagnosis not present

## 2023-02-19 NOTE — MAU Note (Signed)
.  Gail Wong is a 27 y.o. at Unknown here in MAU reporting: pt transferred to  MAU from [facility] for US-was seen for patient complaints of abdominal cramping and spotting-red to dark red moderate amount of 2 hour period per pt report. Abdominal cramping is intermittent. Pt has not taken or received any medication for pain or cramping. Pt reports continuous vaginal bleeding - moderate to heavy amounts since the 19th of October with clots only x 4 days  LMP: 01/06/2023 Onset of complaint: October 19th Pain score: 5 Vitals:   02/19/23 0024  BP: 119/87  Pulse: 80  Resp: 17  Temp: 98.1 F (36.7 C)  SpO2: 100%      Lab orders placed from triage:

## 2023-02-19 NOTE — MAU Provider Note (Signed)
Chief Complaint: Vaginal Bleeding and Abdominal Pain   Event Date/Time   First Provider Initiated Contact with Patient 02/19/23 0103      SUBJECTIVE HPI: Gail Wong is a 27 y.o. G3P0101 at [redacted]w[redacted]d by LMP who presents to maternity admissions reporting onset of bleeding 13 days ago that is sometimes heavy, sometimes moderate.  She took a pregnancy test 2 days ago that was positive.  Since the bleeding continued and she started having clots with the bleeding and abdominal pain, she presented to [facility] at [facility] emergency department and was transferred to MAU for ultrasound.  Hcg was 46, Hgb 11.5 at [facility].    HPI  Past Medical History:  Diagnosis Date   Anemia    last Hgb 12.0 from 01/31/18 lab result under care everywhere ([facility])   Past Surgical History:  Procedure Laterality Date   NO PAST SURGERIES     Social History   Socioeconomic History   Marital status: Single    Spouse name: Not on file   Number of children: Not on file   Years of education: Not on file   Highest education level: Not on file  Occupational History   Not on file  Tobacco Use   Smoking status: Never   Smokeless tobacco: Never  Vaping Use   Vaping status: Never Used  Substance and Sexual Activity   Alcohol use: Never   Drug use: Never   Sexual activity: Yes    Comment: last intercourse 05/24/2018  Other Topics Concern   Not on file  Social History Narrative   Not on file   Social Determinants of Health   Financial Resource Strain: Low Risk  (11/08/2022)   Received from [facility]   Overall Financial Resource Strain (CARDIA)    Difficulty of Paying Living Expenses: Not very hard  Food Insecurity: No Food Insecurity (11/08/2022)   Received from [facility]   Hunger Vital Sign    Worried About Running Out of Food in the Last Year: Never true    Ran Out of Food in the Last Year: Never true  Transportation Needs: No Transportation Needs (11/08/2022)   Received from [facility] - Transportation    Lack of Transportation (Medical): No    Lack of Transportation (Non-Medical): No  Physical Activity: Inactive (11/08/2022)   Received from [facility]   Exercise Vital Sign    Days of Exercise per Week: 1 day    Minutes of Exercise per Session: 0 min  Stress: Stress Concern Present (11/08/2022)   Received from [facility] - Occupational Stress Questionnaire    Feeling of Stress : To some extent  Social Connections: Moderately Integrated (11/08/2022)   Received from [facility]   Social Network    How would you rate your social network (family, work, friends)?: Adequate participation with social networks  Intimate Partner Violence: Not At Risk (11/08/2022)   Received from [facility]   HITS    Over the last 12 months how often did your partner physically hurt you?: 1    Over the last 12 months how often did your partner insult you or talk down to you?: 1    Over the last 12 months how often did your partner threaten you with physical harm?: 1    Over the last 12 months how often did your partner scream or curse at you?: 1   No current facility-administered medications on file prior to encounter.  Current Outpatient Medications on File Prior to Encounter  Medication Sig Dispense Refill   folic acid (FOLVITE) 1 MG tablet Take 1 mg by mouth daily.     Menthol (RICOLA) LOZG Use as directed 1 each in the mouth or throat every 2 (two) hours as needed (sore throat).     Prenatal Vit-Fe Fumarate-FA (PRENATAL MULTIVITAMIN) TABS tablet Take 1 tablet by mouth daily at 12 noon.     Allergies  Allergen Reactions   Amoxicillin Nausea And Vomiting and Rash    Did it involve swelling of the face/tongue/throat, SOB, or low BP? No Did it involve sudden or severe rash/hives, skin peeling, or any reaction on the inside of your mouth or nose? Yes Did you need to seek medical attention at a hospital or doctor's office?  No When did it last happen?  has not had in last 10 years     If all above answers are "NO", may proceed with cephalosporin use.    Lavender Oil Rash   Pork-Derived Products Rash    HA and vomiting    ROS:  Review of Systems  Constitutional:  Negative for chills, fatigue and fever.  Respiratory:  Negative for shortness of breath.   Cardiovascular:  Negative for chest pain.  Gastrointestinal:  Positive for abdominal pain.  Genitourinary:  Positive for vaginal bleeding. Negative for difficulty urinating, dysuria, flank pain, pelvic pain, vaginal discharge and vaginal pain.  Neurological:  Negative for dizziness and headaches.  Psychiatric/Behavioral: Negative.       I have reviewed patient's Past Medical Hx, Surgical Hx, Family Hx, Social Hx, medications and allergies.   Physical Exam  Patient Vitals for the past 24 hrs:  BP Temp Temp src Pulse Resp SpO2 Height Weight  02/19/23 0345 125/74 -- -- 81 -- -- -- --  02/19/23 0024 119/87 98.1 F (36.7 C) Oral 80 17 100 % 4' 11.02" (1.499 m) 45.3 kg   Constitutional: Well-developed, well-nourished female in no acute distress.  Cardiovascular: normal rate Respiratory: normal effort GI: Abd soft, non-tender. Pos BS x 4 MS: Extremities nontender, no edema, normal ROM Neurologic: Alert and oriented x 4.  GU: Neg CVAT.  PELVIC EXAM: Cervix pink, visually closed, without lesion, scant white creamy discharge, vaginal walls and external genitalia normal Bimanual exam: Cervix 0/long/high, firm, anterior, neg CMT, uterus nontender, nonenlarged, adnexa without tenderness, enlargement, or mass   LAB RESULTS Results for orders placed or performed during the hospital encounter of 02/18/23 (from the past 24 hour(s))  CBC with Differential     Status: Abnormal   Collection Time: 02/18/23 10:09 PM  Result Value Ref Range   WBC 5.4 4.0 - 10.5 K/uL   RBC 4.34 3.87 - 5.11 MIL/uL   Hemoglobin 11.5 (L) 12.0 - 15.0 g/dL   HCT 86.5 (L) 78.4 - 69.6 %    MCV 81.8 80.0 - 100.0 fL   MCH 26.5 26.0 - 34.0 pg   MCHC 32.4 30.0 - 36.0 g/dL   RDW 29.5 (H) 28.4 - 13.2 %   Platelets 394 150 - 400 K/uL   nRBC 0.0 0.0 - 0.2 %   Neutrophils Relative % 36 %   Neutro Abs 1.9 1.7 - 7.7 K/uL   Lymphocytes Relative 53 %   Lymphs Abs 2.8 0.7 - 4.0 K/uL   Monocytes Relative 7 %   Monocytes Absolute 0.4 0.1 - 1.0 K/uL   Eosinophils Relative 3 %   Eosinophils Absolute 0.2 0.0 - 0.5 K/uL   Basophils Relative  1 %   Basophils Absolute 0.0 0.0 - 0.1 K/uL   Immature Granulocytes 0 %   Abs Immature Granulocytes 0.01 0.00 - 0.07 K/uL  Pregnancy serum, qualitative     Status: Abnormal   Collection Time: 02/18/23 10:09 PM  Result Value Ref Range   Preg, Serum POSITIVE (A) NEGATIVE  Basic metabolic panel     Status: Abnormal   Collection Time: 02/18/23 10:09 PM  Result Value Ref Range   Sodium 138 135 - 145 mmol/L   Potassium 3.6 3.5 - 5.1 mmol/L   Chloride 104 98 - 111 mmol/L   CO2 28 22 - 32 mmol/L   Glucose, Bld 107 (H) 70 - 99 mg/dL   BUN 10 6 - 20 mg/dL   Creatinine, Ser 2.95 0.44 - 1.00 mg/dL   Calcium 9.4 8.9 - 62.1 mg/dL   GFR, Estimated >30 >86 mL/min   Anion gap 6 5 - 15  Urinalysis, Routine w reflex microscopic -Urine, Clean Catch     Status: Abnormal   Collection Time: 02/18/23 10:09 PM  Result Value Ref Range   Color, Urine YELLOW YELLOW   APPearance HAZY (A) CLEAR   Specific Gravity, Urine 1.025 1.005 - 1.030   pH 8.0 5.0 - 8.0   Glucose, UA NEGATIVE NEGATIVE mg/dL   Hgb urine dipstick MODERATE (A) NEGATIVE   Bilirubin Urine NEGATIVE NEGATIVE   Ketones, ur NEGATIVE NEGATIVE mg/dL   Protein, ur TRACE (A) NEGATIVE mg/dL   Nitrite NEGATIVE NEGATIVE   Leukocytes,Ua NEGATIVE NEGATIVE   RBC / HPF 0-5 0 - 5 RBC/hpf   WBC, UA 0-5 0 - 5 WBC/hpf   Bacteria, UA NONE SEEN NONE SEEN   Squamous Epithelial / HPF 0-5 0 - 5 /HPF   Mucus PRESENT    Amorphous Crystal PRESENT   hCG, quantitative, pregnancy     Status: Abnormal   Collection Time:  02/18/23 10:09 PM  Result Value Ref Range   hCG, Beta Chain, Quant, S 46 (H) <5 mIU/mL       IMAGING US OB LESS THAN 14 WEEKS WITH OB TRANSVAGINAL  Result Date: 02/19/2023 CLINICAL DATA:  Initial evaluation for vaginal bleeding, early pregnancy. EXAM: OBSTETRIC <14 WK [place] AND TRANSVAGINAL OB US TECHNIQUE: Both transabdominal and transvaginal ultrasound examinations were performed for complete evaluation of the gestation as well as the maternal uterus, adnexal regions, and pelvic cul-de-sac. Transvaginal technique was performed to assess early pregnancy. COMPARISON:  None Available. FINDINGS: Intrauterine gestational sac: Negative Yolk sac:  Negative Embryo:  Negative Cardiac Activity: Negative Heart Rate: N/A Subchorionic hemorrhage:  None visualized. Maternal uterus/adnexae: Ovaries within normal limits. No adnexal mass or free fluid. IMPRESSION: 1. Early pregnancy with no discrete IUP or adnexal mass identified. Finding is consistent with a pregnancy of unknown anatomic location. Differential considerations include IUP to early to visualize, recent SAB, or possibly occult ectopic pregnancy. Close clinical monitoring with serial beta HCGs and close interval follow-up ultrasound recommended as clinically warranted. 2. No other acute maternal uterine or adnexal abnormality. Electronically Signed   By: Rise Mu M.D.   On: 02/19/2023 02:36    MAU Management/MDM: Orders Placed This Encounter  Procedures   US OB LESS THAN 14 WEEKS WITH OB TRANSVAGINAL   Discharge patient    No orders of the defined types were placed in this encounter.   Findings today could represent a normal early pregnancy, spontaneous abortion or ectopic pregnancy which can be life-threatening.  Ectopic precautions were given to the patient with  plan to follow up at [facility] in 48 hours for repeat quant hcg to evaluate pregnancy development. Bleeding precautions reviewed.  ASSESSMENT 1. Pregnancy of unknown anatomic  location   2. [redacted] weeks gestation of pregnancy   3. Vaginal bleeding in pregnancy, first trimester     PLAN Discharge home Allergies as of 02/19/2023       Reactions   Amoxicillin Nausea And Vomiting, Rash   Did it involve swelling of the face/tongue/throat, SOB, or low BP? No Did it involve sudden or severe rash/hives, skin peeling, or any reaction on the inside of your mouth or nose? Yes Did you need to seek medical attention at a hospital or doctor's office? No When did it last happen?  has not had in last 10 years     If all above answers are "NO", may proceed with cephalosporin use.   Lavender Oil Rash   Pork-derived Products Rash   HA and vomiting        Medication List     TAKE these medications    folic acid 1 MG tablet Commonly known as: FOLVITE Take 1 mg by mouth daily.   prenatal multivitamin Tabs tablet Take 1 tablet by mouth daily at 12 noon.   Ricola Lozg Use as directed 1 each in the mouth or throat every 2 (two) hours as needed (sore throat).        Follow-up Information     [facility] Follow up.   Specialty: Obstetrics and Gynecology Why: As needed for emergencies Contact information: [address] [place] [address] [phone]                Sharen Counter Certified Nurse-Midwife 02/19/2023  3:58 AM

## 2023-02-21 ENCOUNTER — Ambulatory Visit (INDEPENDENT_AMBULATORY_CARE_PROVIDER_SITE_OTHER): Payer: Self-pay

## 2023-02-21 ENCOUNTER — Other Ambulatory Visit: Payer: Self-pay

## 2023-02-21 VITALS — BP 118/85 | HR 90 | Ht 59.5 in | Wt 99.4 lb

## 2023-02-21 DIAGNOSIS — O3680X0 Pregnancy with inconclusive fetal viability, not applicable or unspecified: Secondary | ICD-10-CM

## 2023-02-21 DIAGNOSIS — Z3A01 Less than 8 weeks gestation of pregnancy: Secondary | ICD-10-CM

## 2023-02-21 LAB — BETA HCG QUANT (REF LAB): hCG Quant: 17 m[IU]/mL

## 2023-02-21 NOTE — Progress Notes (Signed)
Beta HCG Follow-up Visit  [facility] presents to [facility] for follow-up beta HCG lab. She was seen in MAU for  abdominal pain and vaginal bleedingx13 days   on 02/18/23. Patient denies pain today; light bleeding noted since Saturday, but no bleeding today yet per patient. Discussed with patient that we are following beta HCG levels today. Results will be back in approximately 2 hours. Valid contact number for patient confirmed. I will call the patient with results.   Beta HCG results: On 02/18/23 46  Today (02/21/23) 17      Results and patient history reviewed with Dr. Leroy Libman,, who states patient to return in 1 week for f/u non-stat beta hCG (trending to zero). Patient called and informed of plan for follow-up. Scheduled patient for f/u blood draw for non stat beta on Monday, 02/28/23, at 1330. Patient verbalized understanding of results and plan of care and had no other questions or concerns.   [name] 02/21/2023 at 1347

## 2023-02-28 ENCOUNTER — Other Ambulatory Visit: Payer: Medicaid Other

## 2023-02-28 ENCOUNTER — Other Ambulatory Visit: Payer: Self-pay

## 2023-02-28 DIAGNOSIS — O039 Complete or unspecified spontaneous abortion without complication: Secondary | ICD-10-CM
